# Patient Record
Sex: Male | Born: 1955 | Race: White | Hispanic: No | Marital: Married | Smoking: Never smoker
Health system: Southern US, Community
[De-identification: ages and names within clinical notes are randomized; demographics above are authoritative.]

## PROBLEM LIST (undated history)

## (undated) DIAGNOSIS — M6208 Separation of muscle (nontraumatic), other site: Secondary | ICD-10-CM

## (undated) DIAGNOSIS — H9190 Unspecified hearing loss, unspecified ear: Secondary | ICD-10-CM

## (undated) DIAGNOSIS — M1990 Unspecified osteoarthritis, unspecified site: Secondary | ICD-10-CM

## (undated) DIAGNOSIS — U071 COVID-19: Secondary | ICD-10-CM

## (undated) DIAGNOSIS — K219 Gastro-esophageal reflux disease without esophagitis: Secondary | ICD-10-CM

## (undated) DIAGNOSIS — I10 Essential (primary) hypertension: Secondary | ICD-10-CM

## (undated) DIAGNOSIS — E785 Hyperlipidemia, unspecified: Secondary | ICD-10-CM

## (undated) HISTORY — DX: Separation of muscle (nontraumatic), other site: M62.08

## (undated) HISTORY — DX: Hyperlipidemia, unspecified: E78.5

## (undated) HISTORY — DX: Essential (primary) hypertension: I10

## (undated) HISTORY — PX: VASECTOMY: SHX75

## (undated) HISTORY — PX: SEPTOPLASTY: SUR1290

## (undated) HISTORY — DX: Unspecified hearing loss, unspecified ear: H91.90

## (undated) HISTORY — PX: HERNIA REPAIR: SHX51

## (undated) HISTORY — PX: WISDOM TOOTH EXTRACTION: SHX21

## (undated) HISTORY — DX: Gastro-esophageal reflux disease without esophagitis: K21.9

## (undated) HISTORY — PX: OTHER SURGICAL HISTORY: SHX169

---

## 2019-02-25 HISTORY — PX: COLONOSCOPY: SHX174

## 2020-10-02 NOTE — Progress Notes (Signed)
10/03/20 2:14 PM   Alejandro Hodges 10-20-1955 [id number]  Referring provider:  No referring provider defined for this encounter. Chief Complaint  Patient presents with   Benign Prostatic Hypertrophy     HPI: Alejandro Hodges is a 65 y.o.male who presents today to discuss HOLEP.  Patient has a history of BPH and was seen in the ED on 09/17/2020 for urinary retention of >500 in bladder. A foley catheter was placed and he was discharged with Flomax.   Patient has been followed by [facility] for 10 or more years.  Extensive record review today was performed.  Prostate in 2013 was negative for prostate cancer (PSA 5.33).  TRUS volume at the time was 104g.  He a history of an elevated/risking PSA most recent being 8.52 in 01/2020. He had PHI of 53.8 which correlates to a 33% chace of prostate cancer.  He does mention today that at the time of his prostate biopsy, he had a vasovagal episode.  Patient is accompanied by his wife today.  He is somewhat hard of hearing.  He reports that he is increasingly bothered by his urinary symptoms in light of his recent episode of urinary retention, is now ready to consider surgery for his BPH.  He has been on line, reading about different options and given the size of his prostate, HoLEP was mention.  He is also familiar with one of my patients who had the procedure done had an excellent outcome.  He also mentions today that he and his wife will be traveling to [place] in October.  She has an affinity for penguin's.   IPSS     Row Name 10/03/20 1300         International Prostate Symptom Score   How often have you had the sensation of not emptying your bladder? More than half the time     How often have you had to urinate less than every two hours? Almost always     How often have you found you stopped and started again several times when you urinated? More than half the time     How often have you found it difficult to postpone urination? More than  half the time     How often have you had a weak urinary stream? More than half the time     How often have you had to strain to start urination? Less than half the time     How many times did you typically get up at night to urinate? 2 Times     Total IPSS Score 25           Quality of Life due to urinary symptoms     If you were to spend the rest of your life with your urinary condition just the way it is now how would you feel about that? Terrible             Score:  1-7 Mild 8-19 Moderate 20-35 Severe      PMH: Past Medical History:  Diagnosis Date   Diastasis recti    GERD (gastroesophageal reflux disease)    High frequency hearing loss    Hyperlipoproteinemia    Hypertension     Surgical History: Past Surgical History:  Procedure Laterality Date   gingival graft     HERNIA REPAIR     VASECTOMY     WISDOM TOOTH EXTRACTION      Home Medications:  Allergies as of 10/03/2020  Reactions   Ciprofloxacin Other (See Comments)   Other reaction(s): Myalgias (Muscle Pain), Other (See Comments) tendonitis tendonitis   Meloxicam Other (See Comments)   Other reaction(s): Other Other Reaction: OTHER REACTION Other Reaction: OTHER REACTION   Acacia    Other reaction(s): Other (See Comments)   Nsaids    Other reaction(s): Unknown Son may have had headaches from Naproxen, pt may have had same reaction.        Medication List        Accurate as of October 03, 2020  2:14 PM. If you have any questions, ask your nurse or doctor.          Cholecalciferol 25 MCG (1000 UT) capsule Vitamin D3 25 mcg (1,000 unit) capsule   1500 units every day by oral route.   lisinopril 10 MG tablet Commonly known as: ZESTRIL lisinopril 10 mg tablet   Oil of Oregano 1500 MG Caps oregano oil   Omega-3 1000 MG Caps   omeprazole 40 MG capsule Commonly known as: PRILOSEC Take by mouth.   OVER THE COUNTER MEDICATION Take 500 mg by mouth daily. Take 500 mg by mouth  2 (two) times daily [name] (Prostate Support, 2 tablets two times a day   saw palmetto 80 MG capsule Take by mouth.   tamsulosin 0.4 MG Caps capsule Commonly known as: FLOMAX Take 0.4 mg by mouth daily.        Allergies:  Allergies  Allergen Reactions   Ciprofloxacin Other (See Comments)    Other reaction(s): Myalgias (Muscle Pain), Other (See Comments) tendonitis tendonitis    Meloxicam Other (See Comments)    Other reaction(s): Other Other Reaction: OTHER REACTION Other Reaction: OTHER REACTION    Acacia     Other reaction(s): Other (See Comments)   Nsaids     Other reaction(s): Unknown Son may have had headaches from Naproxen, pt may have had same reaction.    Family History: Family History  Problem Relation Age of Onset   Colon cancer Mother    Colon cancer Paternal Grandmother    Bladder Cancer Neg Hx    Prostate cancer Neg Hx    Kidney cancer Neg Hx    Testicular cancer Neg Hx     Social History:  reports that he has never smoked. He has never used smokeless tobacco. He reports previous alcohol use. He reports that he does not use drugs.   Physical Exam: BP (!) 159/103   Pulse 86   Ht 5\' 9"  (1.753 m)   Wt 188 lb (85.3 kg)   BMI 27.76 kg/m   Constitutional:  Alert and oriented, No acute distress. HEENT: [place] AT, moist mucus membranes.  Trachea midline, no masses. Cardiovascular: No clubbing, cyanosis, or edema. Respiratory: Normal respiratory effort, no increased work of breathing. Skin: No rashes, bruises or suspicious lesions. Neurologic: Grossly intact, no focal deficits, moving all 4 extremities. Psychiatric: Normal mood and affect.  Pertinent Imaging: Results for orders placed or performed in visit on 10/03/20  Microscopic Examination   Urine  Result Value Ref Range   WBC, UA 0-5 0 - 5 /hpf   RBC 0-2 0 - 2 /hpf   Epithelial Cells (non renal) None seen 0 - 10 /hpf   Bacteria, UA None seen None seen/Few  Urinalysis, Complete  Result  Value Ref Range   Specific Gravity, UA 1.015 1.005 - 1.030   pH, UA 7.5 5.0 - 7.5   Color, UA Yellow Yellow   Appearance Ur Clear  Clear   Leukocytes,UA Negative Negative   Protein,UA Negative Negative/Trace   Glucose, UA Negative Negative   Ketones, UA Negative Negative   RBC, UA Trace (A) Negative   Bilirubin, UA Negative Negative   Urobilinogen, Ur 0.2 0.2 - 1.0 mg/dL   Nitrite, UA Negative Negative   Microscopic Examination See below:   Bladder Scan (Post Void Residual) in office  Result Value Ref Range   Scan Result 170       Assessment & Plan:   BPH with outlet obstruction  - Continue Flomax   -Patient is interested in HOLEP; discussed need for anatomic evaluation for the purpose of surgical planning including cystoscopy/more recent TRUS  - We reviewed the surgery in detail today including the preoperative, intraoperative, and postoperative course.  This will most likely be an outpatient procedure pending the degree of post op hematuria.  He will go home with catheter for a few days post op and will either be taught how to remove his own catheter or return to the office for catheter removal.  Risk of bleeding, infection, damage surrounding structures, injury to the bladder/ urethral, bladder neck contracture, ureteral stricture, retrograde ejaculation, stress/ urge incontinence, exacerbation of irritative voiding symptoms were all discussed in detail.     2. Elevated PSA  - elevated and rising with a fairly high PHI  - prostate biopsy to rule out malignancy prior to undergoing outlet procedure  -We discussed prostate biopsy in detail including the procedure itself, the risks of blood in the urine, stool, and ejaculate, serious infection, and discomfort. He is willing to proceed with this as discussed.   3. Incomplete bladder emptying  - PVR today was 170 mL   Follow-up with cystoscopy and prostate biopsy   Tawni Millers as a scribe for [name],  MD.,have documented all relevant documentation on the behalf of [name], MD,as directed by  [name], MD while in the presence of [name], MD.  I have reviewed the above documentation for accuracy and completeness, and I agree with the above.   [name], MD  [facility] [address] [place], [place] [address] [phone]

## 2020-10-03 ENCOUNTER — Encounter: Payer: Self-pay | Admitting: Urology

## 2020-10-03 ENCOUNTER — Other Ambulatory Visit: Payer: Self-pay

## 2020-10-03 ENCOUNTER — Ambulatory Visit (INDEPENDENT_AMBULATORY_CARE_PROVIDER_SITE_OTHER): Payer: Medicare Other | Admitting: Urology

## 2020-10-03 VITALS — BP 159/103 | HR 86 | Ht 69.0 in | Wt 188.0 lb

## 2020-10-03 DIAGNOSIS — N400 Enlarged prostate without lower urinary tract symptoms: Secondary | ICD-10-CM

## 2020-10-03 DIAGNOSIS — R339 Retention of urine, unspecified: Secondary | ICD-10-CM

## 2020-10-03 DIAGNOSIS — R972 Elevated prostate specific antigen [PSA]: Secondary | ICD-10-CM

## 2020-10-03 LAB — URINALYSIS, COMPLETE
Bilirubin, UA: NEGATIVE
Glucose, UA: NEGATIVE
Ketones, UA: NEGATIVE
Leukocytes,UA: NEGATIVE
Nitrite, UA: NEGATIVE
Protein,UA: NEGATIVE
Specific Gravity, UA: 1.015 (ref 1.005–1.030)
Urobilinogen, Ur: 0.2 mg/dL (ref 0.2–1.0)
pH, UA: 7.5 (ref 5.0–7.5)

## 2020-10-03 LAB — MICROSCOPIC EXAMINATION
Bacteria, UA: NONE SEEN
Epithelial Cells (non renal): NONE SEEN /hpf (ref 0–10)

## 2020-10-03 LAB — BLADDER SCAN AMB NON-IMAGING: Scan Result: 170

## 2020-10-03 NOTE — Patient Instructions (Addendum)
Holmium Laser Enucleation of the Prostate (HoLEP)  HoLEP is a treatment for men with benign prostatic hyperplasia (BPH). The laser surgery removed blockages of urine flow, and is done without any incisions on the body.     What is HoLEP?  HoLEP is a type of laser surgery used to treat obstruction (blockage) of urine flow as a result of benign prostatic hyperplasia (BPH). In men with BPH, the prostate gland is not cancerous, but has become enlarged. An enlarged prostate can result in a number of urinary tract symptoms such as weak urinary stream, difficulty in starting urination, inability to urinate, frequent urination, or getting up at night to urinate.  HoLEP was developed in the 1990's as a more effective and less expensive surgical option for BPH, compared to other surgical options such as laser vaporization(PVP/greenlight laser), transurethral resection of the prostate(TURP), and open simple prostatectomy.   What happens during a HoLEP?  HoLEP requires general anesthesia ("asleep" throughout the procedure).   An antibiotic is given to reduce the risk of infection  A surgical instrument called a resectoscope is inserted through the urethra (the tube that carries urine from the bladder). The resectoscope has a camera that allows the surgeon to view the internal structure of the prostate gland, and to see where the incisions are being made during surgery.  The laser is inserted into the resectoscope and is used to enucleate (free up) the enlarged prostate tissue from the capsule (outer shell) and then to seal up any blood vessels. The tissue that has been removed is pushed back into the bladder.  A morcellator is placed through the resectoscope, and is used to suction out the prostate tissue that has been pushed into the bladder.  When the prostate tissue has been removed, the resectoscope is removed, and a foley catheter is placed to allow healing and drain the urine from the  bladder.     What happens after a HoLEP?  More than 90% of patients go home the same day a few hours after surgery. Less than 10% will be admitted to the hospital overnight for observation to monitor the urine, or if they have other medical problems.  Fluid is flushed through the catheter for about 1 hour after surgery to clear any blood from the urine. It is normal to have some blood in the urine after surgery. The need for blood transfusion is extremely rare.  Eating and drinking are permitted after the procedure once the patient has fully awakened from anesthesia.  The catheter is usually removed 2-3 days after surgery- the patient will come to clinic to have the catheter removed and make sure they can urinate on their own.  It is very important to drink lots of fluids after surgery for one week to keep the bladder flushed.  At first, there may be some burning with urination, but this typically improved within a few hours to days. Most patients do not have a significant amount of pain, and narcotic pain medications are rarely needed.  Symptoms of urinary frequency, urgency, and even leakage are NORMAL for the first few weeks after surgery as the bladder adjusts after having to work hard against blockage from the prostate for many years. This will improve, but can sometimes take several months.  The use of pelvic floor exercises (Kegel exercises) can help improve problems with urinary incontinence.   After catheter removal, patients will be seen at 6 weeks and 6 months for symptom check  No heavy lifting for   at least 2-3 weeks after surgery, however patients can walk and do light activities the first day after surgery. Return to work time depends on occupation.    What are the advantages of HoLEP?  HoLEP has been studied in many different parts of the world and has been shown to be a safe and effective procedure. Although there are many types of BPH surgeries available, HoLEP offers a  unique advantage in being able to remove a large amount of tissue without any incisions on the body, even in very large prostates, while decreasing the risk of bleeding and providing tissue for pathology (to look for cancer). This decreases the need for blood transfusions during surgery, minimizes hospital stay, and reduces the risk of needing repeat treatment.  What are the side effects of HoLEP?  Temporary burning and bleeding during urination. Some blood may be seen in the urine for weeks after surgery and is part of the healing process.  Urinary incontinence (inability to control urine flow) is expected in all patients immediately after surgery and they should wear pads for the first few days/weeks. This typically improves over the course of several weeks. Performing Kegel exercises can help decrease leakage from stress maneuvers such as coughing, sneezing, or lifting. The rate of long term leakage is very low. Patients may also have leakage with urgency and this may be treated with medication. The risk of urge incontinence can be dependent on several factors including age, prostate size, symptoms, and other medical problems.  Retrograde ejaculation or "backwards ejaculation." In 75% of cases, the patient will not see any fluid during ejaculation after surgery.  Erectile function is generally not significantly affected.   What are the risks of HoLEP?  Injury to the urethra or development of scar tissue at a later date  Injury to the capsule of the prostate (typically treated with longer catheterization).  Injury to the bladder or ureteral orifices (where the urine from the kidney drains out)  Infection of the bladder, testes, or kidneys  Return of urinary obstruction at a later date requiring another operation (<2%)  Need for blood transfusion or re-operation due to bleeding  Failure to relieve all symptoms and/or need for prolonged catheterization after surgery  5-15% of patients are  found to have previously undiagnosed prostate cancer in their specimen. Prostate cancer can be treated after HoLEP.  Standard risks of anesthesia including blood clots, heart attacks, etc  When should I call my doctor?  Fever over 101.3 degrees  Inability to urinate, or large blood clots in the urine    Prostate Biopsy Instructions  Stop all aspirin or blood thinners (aspirin, plavix, coumadin, warfarin, motrin, ibuprofen, advil, aleve, naproxen, naprosyn) for 7 days prior to the procedure.  If you have any questions about stopping these medications, please contact your primary care physician or cardiologist.  Having a light meal prior to the procedure is recommended.  If you are diabetic or have low blood sugar please bring a small snack or glucose tablet.  A Fleets enema is needed to be purchased over the counter at a local pharmacy and used 2 hours before you scheduled appointment.  This can be purchased over the counter at any pharmacy.  Antibiotics will be administered in the clinic at the time of the procedure unless otherwise specified.    Please bring someone with you to the procedure to drive you home.  A follow up appointment has been scheduled for you to receive the results of the biopsy.  If you have any questions or concerns, please feel free to call the office at [phone] or send a Mychart message.    Thank you, Staff at [facility]    Cystoscopy Cystoscopy is a procedure that is used to help diagnose and sometimes treat conditions that affect the lower urinary tract. The lower urinary tract includes the bladder and the urethra. The urethra is the tube that drains urine from the bladder. Cystoscopy is done using a thin, tube-shaped instrument with a light and camera at the end (cystoscope). The cystoscope may be hard or flexible, depending on the goal of the procedure. The cystoscope is inserted through the urethra, into the bladder. Cystoscopy may be  recommended if you have: Urinary tract infections that keep coming back. Blood in the urine (hematuria). An inability to control when you urinate (urinary incontinence) or an overactive bladder. Unusual cells found in a urine sample. A blockage in the urethra, such as a urinary stone. Painful urination. An abnormality in the bladder found during an intravenous pyelogram (IVP) or CT scan. Cystoscopy may also be done to remove a sample of tissue to be examined under a microscope (biopsy). What are the risks? Generally, this is a safe procedure. However, problems may occur, including: Infection. Bleeding.  What happens during the procedure?  You will be given one or more of the following: A medicine to numb the area (local anesthetic). The area around the opening of your urethra will be cleaned. The cystoscope will be passed through your urethra into your bladder. Germ-free (sterile) fluid will flow through the cystoscope to fill your bladder. The fluid will stretch your bladder so that your health care provider can clearly examine your bladder walls. Your doctor will look at the urethra and bladder. The cystoscope will be removed The procedure may vary among health care providers  What can I expect after the procedure? After the procedure, it is common to have: Some soreness or pain in your abdomen and urethra. Urinary symptoms. These include: Mild pain or burning when you urinate. Pain should stop within a few minutes after you urinate. This may last for up to 1 week. A small amount of blood in your urine for several days. Feeling like you need to urinate but producing only a small amount of urine. Follow these instructions at home: General instructions Return to your normal activities as told by your health care provider.  Do not drive for 24 hours if you were given a sedative during your procedure. Watch for any blood in your urine. If the amount of blood in your urine increases,  call your health care provider. If a tissue sample was removed for testing (biopsy) during your procedure, it is up to you to get your test results. Ask your health care provider, or the department that is doing the test, when your results will be ready. Drink enough fluid to keep your urine pale yellow. Keep all follow-up visits as told by your health care provider. This is important. Contact a health care provider if you: Have pain that gets worse or does not get better with medicine, especially pain when you urinate. Have trouble urinating. Have more blood in your urine. Get help right away if you: Have blood clots in your urine. Have abdominal pain. Have a fever or chills. Are unable to urinate. Summary Cystoscopy is a procedure that is used to help diagnose and sometimes treat conditions that affect the lower urinary tract. Cystoscopy is done using a  thin, tube-shaped instrument with a light and camera at the end. After the procedure, it is common to have some soreness or pain in your abdomen and urethra. Watch for any blood in your urine. If the amount of blood in your urine increases, call your health care provider. If you were prescribed an antibiotic medicine, take it as told by your health care provider. Do not stop taking the antibiotic even if you start to feel better. This information is not intended to replace advice given to you by your health care provider. Make sure you discuss any questions you have with your health care provider. Document Revised: 02/02/2018 Document Reviewed: 02/02/2018 Elsevier Patient Education  2020 [employer].

## 2020-10-10 ENCOUNTER — Ambulatory Visit: Payer: Self-pay | Admitting: Urology

## 2020-10-22 NOTE — Progress Notes (Signed)
   10/24/20     HPI:  Alejandro Hodges is a 65 y.o. male who presents today for prostate biopsy/cystoscopy.   He has a history of elevated/rising PSA his most recent on 10/15/2020 was 9.26. In 2013 he was negative for prostate cancer with a PSA of 5.33. He had PHI of 53.8 which correlates to a 33% chace of prostate cancer.   He also has a history of BPH and was seen in the ED 09/17/2020 for urinary retention of >500 in bladder. He was placed on Flomax.        Vitals:   10/23/20 1131  BP: (!) 167/99  Pulse: 74   NED. A&Ox3.   No respiratory distress   Abd soft, NT, ND Normal external genitalia with patent urethral meatus   Cystoscopy Procedure Note  Patient identification was confirmed, informed consent was obtained, and patient was prepped using Betadine solution.  Lidocaine jelly was administered per urethral meatus.     Pre-Procedure: - Inspection reveals a normal caliber ureteral meatus.  Procedure: The flexible cystoscope was introduced without difficulty - No urethral strictures/lesions are present. -mildly trabeculated bladder - Enlarged prostate trilobar coaptation and bullous edema of the mucosa of prostate - Elevated bladder neck - Bilateral ureteral orifices distorted by elevated bladder neck, unable to visualize - Bladder mucosa  reveals no ulcers, tumors, or lesions - No bladder stones - Mild trabeculation  Retroflexion shows unremarkable    Post-Procedure: - Patient tolerated the procedure well   Prostate Biopsy Procedure   Informed consent was obtained after discussing risks/benefits of the procedure.  A time out was performed to ensure correct patient identity.  Pre-Procedure: - Last PSA Level: 9.26  - Gentamicin given prophylactically - Levaquin 500 mg administered PO -Transrectal Ultrasound performed revealing a 255.3 gm prostate -No significant hypoechoic or median lobe noted  Procedure: - Prostate block performed using 10 cc 1% lidocaine  and biopsies taken from sextant areas, a total of 12 under ultrasound guidance.  Post-Procedure: - Patient tolerated the procedure well - He was counseled to seek immediate medical attention if experiences any severe pain, significant bleeding, or fevers  Assessment and Plan   BPH with outlet obstruction  - continue Flomax  -[name], will discuss this further at his follow-up visit with biopsy results  2. Elevated PSA - PSA is likely appropriate for prostate volume  - Will let us know if he wants virtual visit    - Return in one week to discuss biopsy results/ consideration of HoLEP

## 2020-10-23 ENCOUNTER — Other Ambulatory Visit: Payer: Self-pay

## 2020-10-23 ENCOUNTER — Encounter: Payer: Self-pay | Admitting: Urology

## 2020-10-23 ENCOUNTER — Ambulatory Visit (INDEPENDENT_AMBULATORY_CARE_PROVIDER_SITE_OTHER): Payer: Medicare Other | Admitting: Urology

## 2020-10-23 VITALS — BP 167/99 | HR 74 | Ht 69.0 in | Wt 188.0 lb

## 2020-10-23 DIAGNOSIS — R339 Retention of urine, unspecified: Secondary | ICD-10-CM | POA: Diagnosis not present

## 2020-10-23 DIAGNOSIS — N400 Enlarged prostate without lower urinary tract symptoms: Secondary | ICD-10-CM | POA: Diagnosis not present

## 2020-10-23 LAB — URINALYSIS, COMPLETE
Bilirubin, UA: NEGATIVE
Glucose, UA: NEGATIVE
Ketones, UA: NEGATIVE
Leukocytes,UA: NEGATIVE
Nitrite, UA: NEGATIVE
Protein,UA: NEGATIVE
RBC, UA: NEGATIVE
Specific Gravity, UA: 1.015 (ref 1.005–1.030)
Urobilinogen, Ur: 0.2 mg/dL (ref 0.2–1.0)
pH, UA: 7.5 (ref 5.0–7.5)

## 2020-10-23 LAB — MICROSCOPIC EXAMINATION
Bacteria, UA: NONE SEEN
Epithelial Cells (non renal): NONE SEEN /hpf (ref 0–10)
RBC, Urine: NONE SEEN /hpf (ref 0–2)
WBC, UA: NONE SEEN /hpf (ref 0–5)

## 2020-10-23 MED ORDER — GENTAMICIN SULFATE 40 MG/ML IJ SOLN
80.0000 mg | Freq: Once | INTRAMUSCULAR | Status: AC
Start: 2020-10-23 — End: 2020-10-23
  Administered 2020-10-23: 80 mg via INTRAMUSCULAR

## 2020-10-23 MED ORDER — LEVOFLOXACIN 500 MG PO TABS
500.0000 mg | ORAL_TABLET | Freq: Once | ORAL | Status: AC
Start: 2020-10-23 — End: 2020-10-23
  Administered 2020-10-23: 500 mg via ORAL

## 2020-10-23 NOTE — Patient Instructions (Signed)
Transrectal Ultrasound-Guided Prostate Biopsy, Care After This sheet gives you information about how to care for yourself after your procedure. Your doctor may also give you more specific instructions. If youhave problems or questions, contact your doctor. What can I expect after the procedure? After the procedure, it is common to have: Pain and discomfort in your butt, especially while sitting. Pink-colored pee (urine), due to small amounts of blood in the pee. Burning while peeing (urinating). Blood in your poop (stool). Bleeding from your butt. Blood in your semen. Follow these instructions at home: Medicines Take over-the-counter and prescription medicines only as told by your doctor. If you were prescribed antibiotic medicine, take it as told by your doctor. Do not stop taking the antibiotic even if you start to feel better. Activity  Do not drive for 24 hours if you were given a medicine to help you relax (sedative) during your procedure. Return to your normal activities as told by your doctor. Ask your doctor what activities are safe for you. Ask your doctor when it is okay for you to have sex. Do not lift anything that is heavier than 10 lb (4.5 kg), or the limit that you are told, until your doctor says that it is safe.  General instructions  Drink enough water to keep your pee pale yellow. Watch your pee, poop, and semen for new bleeding or bleeding that gets worse. Keep all follow-up visits as told by your doctor. This is important.  Contact a doctor if you: Have blood clots in your pee or poop. Notice that your pee smells bad or unusual. Have very bad belly pain. Have trouble peeing. Notice that your lower belly feels firm. Have blood in your pee for more than 2 weeks after the procedure. Have blood in your semen for more than 2 months after the procedure. Have problems getting an erection. Feel sick to your stomach (nauseous) or throw up (vomit). Have new or worse  bleeding in your pee, poop, or semen. Get help right away if you: Have a fever or chills. Have bright red pee. Have very bad pain that does not get better with medicine. Cannot pee. Summary After this procedure, it is common to have pain and discomfort around your butt, especially while sitting. You may have blood in your pee and poop. It is common to have blood in your semen for 1-2 months. If you were prescribed antibiotic medicine, take it as told by your doctor. Do not stop taking the antibiotic even if you start to feel better. Get help right away if you have a fever or chills. This information is not intended to replace advice given to you by your health care provider. Make sure you discuss any questions you have with your healthcare provider. Document Revised: 12/26/2019 Document Reviewed: 10/27/2019 Elsevier Patient Education  2022 [employer] Inc[employer]  

## 2020-10-24 ENCOUNTER — Telehealth: Payer: Self-pay | Admitting: Urology

## 2020-10-24 ENCOUNTER — Other Ambulatory Visit: Payer: Self-pay | Admitting: Urology

## 2020-10-24 DIAGNOSIS — N401 Enlarged prostate with lower urinary tract symptoms: Secondary | ICD-10-CM

## 2020-10-24 LAB — SURGICAL PATHOLOGY

## 2020-10-24 NOTE — Telephone Encounter (Signed)
Patient was seen in the office yesterday, 10/23/20 by Dr. Apolinar Junes for a prostate biopsy and cystoscopy.    He has been wearing disposable undergarments since the procedure and has noticed a few drops of blood in the front section of his undergarments.    Patient was advised that what he is experiencing is normal for those procedures.  I reviewed his AVS with him as well.

## 2020-10-25 ENCOUNTER — Telehealth: Payer: Self-pay | Admitting: *Deleted

## 2020-10-25 NOTE — Telephone Encounter (Addendum)
Patient advised-voiced understanding  ----- Message from [name], MD sent at 10/24/2020  3:58 PM EDT ----- Please let this patient know that his prostate biopsy was negative.  I still would like him to follow-up with me next week to review his upcoming procedure and ask/answer any additional questions.  [name], MD

## 2020-10-29 NOTE — Progress Notes (Signed)
10/30/20 4:17 PM   Alejandro Hodges [birth year] [id number]  Referring provider:  No referring provider defined for this encounter. Chief Complaint  Patient presents with   Results     HPI: Alejandro Hodges is a 65 y.o.male who has a personal history of elevate/rising PSA who returns today for prostate biopsy results.    He has a history of elevated/rising PSA his most recent on 10/15/2020 was 9.26. In 2013 he was negative for prostate cancer with a PSA of 5.33. He had PHI of 53.8 which correlates to a 33% chace of prostate cancer. Surgical pathology revealed negative for malignancy.    He also has a history of BPH and was seen in the ED 09/17/2020 for urinary retention of >500 in bladder. He was placed on Flomax.   Currently does not have a foley but worried about recurrent retention.    His prostate was 255.3 gm on 10/23/2020.   He is doing well today and is accompanied by his wife. He has a few question on HoLEP procedure.    PMH: Past Medical History:  Diagnosis Date   Diastasis recti    GERD (gastroesophageal reflux disease)    High frequency hearing loss    Hyperlipoproteinemia    Hypertension     Surgical History: Past Surgical History:  Procedure Laterality Date   gingival graft     HERNIA REPAIR     VASECTOMY     WISDOM TOOTH EXTRACTION      Home Medications:  Allergies as of 10/30/2020       Reactions   Ciprofloxacin Other (See Comments)   Other reaction(s): Myalgias (Muscle Pain), Other (See Comments) tendonitis tendonitis   Meloxicam Other (See Comments)   Other reaction(s): Other Other Reaction: OTHER REACTION Other Reaction: OTHER REACTION   Acacia    Other reaction(s): Other (See Comments)   Nsaids    Other reaction(s): Unknown Son may have had headaches from Naproxen, pt may have had same reaction.        Medication List        Accurate as of October 30, 2020  4:17 PM. If you have any questions, ask your nurse or doctor.          STOP  taking these medications    Omega-3 1000 MG Caps Stopped by: [name], MD       TAKE these medications    Cholecalciferol 25 MCG (1000 UT) capsule Vitamin D3 25 mcg (1,000 unit) capsule   1500 units every day by oral route.   lisinopril 10 MG tablet Commonly known as: ZESTRIL lisinopril 10 mg tablet   Oil of Oregano 1500 MG Caps oregano oil   omeprazole 40 MG capsule Commonly known as: PRILOSEC Take by mouth.   OVER THE COUNTER MEDICATION Take 500 mg by mouth daily. Take 500 mg by mouth 2 (two) times daily [name] (Prostate Support, 2 tablets two times a day   saw palmetto 80 MG capsule Take by mouth.   tamsulosin 0.4 MG Caps capsule Commonly known as: FLOMAX Take 0.4 mg by mouth daily.        Allergies:  Allergies  Allergen Reactions   Ciprofloxacin Other (See Comments)    Other reaction(s): Myalgias (Muscle Pain), Other (See Comments) tendonitis tendonitis    Meloxicam Other (See Comments)    Other reaction(s): Other Other Reaction: OTHER REACTION Other Reaction: OTHER REACTION    Acacia     Other reaction(s): Other (See Comments)   Nsaids  Other reaction(s): Unknown Son may have had headaches from Naproxen, pt may have had same reaction.    Family History: Family History  Problem Relation Age of Onset   Colon cancer Mother    Colon cancer Paternal Grandmother    Bladder Cancer Neg Hx    Prostate cancer Neg Hx    Kidney cancer Neg Hx    Testicular cancer Neg Hx     Social History:  reports that he has never smoked. He has never used smokeless tobacco. He reports that he does not currently use alcohol. He reports that he does not use drugs.   Physical Exam: BP (!) 151/91   Pulse 79   Ht 5' 9" (1.753 m)   Wt 188 lb (85.3 kg)   BMI 27.76 kg/m   Constitutional:  Alert and oriented, No acute distress.  Accompanied by his wife today. HEENT: [name] AT, moist mucus membranes.  Trachea midline, no masses. Cardiovascular: No  clubbing, cyanosis, or edema. Respiratory: Normal respiratory effort, no increased work of breathing. Skin: No rashes, bruises or suspicious lesions. Neurologic: Grossly intact, no focal deficits, moving all 4 extremities. Psychiatric: Normal mood and affect.   Assessment & Plan:   BPH with outlet obstruction  - Continue Flomax See below   Enlarged prostate - We discussed alternatives including TURP vs. holmium laser enucleation of the prostate vs. greenlight laser ablation. Differences between the surgical procedures were discussed as well as the risks and benefits of each.  [name].  We discussed the common postoperative course following holep including need for overnight Foley catheter, temporary worsening of irritative voiding symptoms, and stress incontinence which typically lasts up to 6 months but can persist.  We discussed retrograde ejaculation and damage to surrounding structures including the urinary sphincter. Other uncommon complications including hematuria and urinary tract infection.  He understands all of the above and is willing to proceed as planned.  - All question   Elevated PSA  - Prostate biopsy was negative for malignancy    I,[name],acting as a scribe for [name], MD.,have documented all relevant documentation on the behalf of [name], MD,as directed by  [name], MD while in the presence of [name], MD.  I have reviewed the above documentation for accuracy and completeness, and I agree with the above.   [name], MD  I spent 30 total minutes on the day of the encounter including pre-visit review of the medical record, face-to-face time with the patient, and post visit ordering of labs/imaging/tests.  The majority of the time was spent reviewing the patient's many questions which both he and his wife brought in list form today.   [facility] [address], [address] [place], [place] [address] [phone]  

## 2020-10-29 NOTE — H&P (View-Only) (Signed)
10/30/20 4:17 PM   Alejandro Hodges [birth year] [id number]  Referring provider:  No referring provider defined for this encounter. Chief Complaint  Patient presents with   Results     HPI: Alejandro Hodges is a 65 y.o.male who has a personal history of elevate/rising PSA who returns today for prostate biopsy results.    He has a history of elevated/rising PSA his most recent on 10/15/2020 was 9.26. In 2013 he was negative for prostate cancer with a PSA of 5.33. He had PHI of 53.8 which correlates to a 33% chace of prostate cancer. Surgical pathology revealed negative for malignancy.    He also has a history of BPH and was seen in the ED 09/17/2020 for urinary retention of >500 in bladder. He was placed on Flomax.   Currently does not have a foley but worried about recurrent retention.    His prostate was 255.3 gm on 10/23/2020.   He is doing well today and is accompanied by his wife. He has a few question on HoLEP procedure.    PMH: Past Medical History:  Diagnosis Date   Diastasis recti    GERD (gastroesophageal reflux disease)    High frequency hearing loss    Hyperlipoproteinemia    Hypertension     Surgical History: Past Surgical History:  Procedure Laterality Date   gingival graft     HERNIA REPAIR     VASECTOMY     WISDOM TOOTH EXTRACTION      Home Medications:  Allergies as of 10/30/2020       Reactions   Ciprofloxacin Other (See Comments)   Other reaction(s): Myalgias (Muscle Pain), Other (See Comments) tendonitis tendonitis   Meloxicam Other (See Comments)   Other reaction(s): Other Other Reaction: OTHER REACTION Other Reaction: OTHER REACTION   Acacia    Other reaction(s): Other (See Comments)   Nsaids    Other reaction(s): Unknown Son may have had headaches from Naproxen, pt may have had same reaction.        Medication List        Accurate as of October 30, 2020  4:17 PM. If you have any questions, ask your nurse or doctor.          STOP  taking these medications    Omega-3 1000 MG Caps Stopped by: [name], MD       TAKE these medications    Cholecalciferol 25 MCG (1000 UT) capsule Vitamin D3 25 mcg (1,000 unit) capsule   1500 units every day by oral route.   lisinopril 10 MG tablet Commonly known as: ZESTRIL lisinopril 10 mg tablet   Oil of Oregano 1500 MG Caps oregano oil   omeprazole 40 MG capsule Commonly known as: PRILOSEC Take by mouth.   OVER THE COUNTER MEDICATION Take 500 mg by mouth daily. Take 500 mg by mouth 2 (two) times daily [name] (Prostate Support, 2 tablets two times a day   saw palmetto 80 MG capsule Take by mouth.   tamsulosin 0.4 MG Caps capsule Commonly known as: FLOMAX Take 0.4 mg by mouth daily.        Allergies:  Allergies  Allergen Reactions   Ciprofloxacin Other (See Comments)    Other reaction(s): Myalgias (Muscle Pain), Other (See Comments) tendonitis tendonitis    Meloxicam Other (See Comments)    Other reaction(s): Other Other Reaction: OTHER REACTION Other Reaction: OTHER REACTION    Acacia     Other reaction(s): Other (See Comments)   Nsaids  Other reaction(s): Unknown Son may have had headaches from Naproxen, pt may have had same reaction.    Family History: Family History  Problem Relation Age of Onset   Colon cancer Mother    Colon cancer Paternal Grandmother    Bladder Cancer Neg Hx    Prostate cancer Neg Hx    Kidney cancer Neg Hx    Testicular cancer Neg Hx     Social History:  reports that he has never smoked. He has never used smokeless tobacco. He reports that he does not currently use alcohol. He reports that he does not use drugs.   Physical Exam: BP (!) 151/91   Pulse 79   Ht 5\' 9"  (1.753 m)   Wt 188 lb (85.3 kg)   BMI 27.76 kg/m   Constitutional:  Alert and oriented, No acute distress.  Accompanied by his wife today. HEENT: [place] AT, moist mucus membranes.  Trachea midline, no masses. Cardiovascular: No  clubbing, cyanosis, or edema. Respiratory: Normal respiratory effort, no increased work of breathing. Skin: No rashes, bruises or suspicious lesions. Neurologic: Grossly intact, no focal deficits, moving all 4 extremities. Psychiatric: Normal mood and affect.   Assessment & Plan:   BPH with outlet obstruction  - Continue Flomax See below   Enlarged prostate - We discussed alternatives including TURP vs. holmium laser enucleation of the prostate vs. greenlight laser ablation. Differences between the surgical procedures were discussed as well as the risks and benefits of each.  [name].  We discussed the common postoperative course following holep including need for overnight Foley catheter, temporary worsening of irritative voiding symptoms, and stress incontinence which typically lasts up to 6 months but can persist.  We discussed retrograde ejaculation and damage to surrounding structures including the urinary sphincter. Other uncommon complications including hematuria and urinary tract infection.  He understands all of the above and is willing to proceed as planned.  - All question   Elevated PSA  - Prostate biopsy was negative for malignancy    I,[name],acting as a scribe for , MD.,have documented all relevant documentation on the behalf of [name], MD,as directed by  [name], MD while in the presence of [name], MD.  I have reviewed the above documentation for accuracy and completeness, and I agree with the above.   [name], MD  I spent 30 total minutes on the day of the encounter including pre-visit review of the medical record, face-to-face time with the patient, and post visit ordering of labs/imaging/tests.  The majority of the time was spent reviewing the patient's many questions which both he and his wife brought in list form today.   [facility] [address] [place], [place] [phone]

## 2020-10-30 ENCOUNTER — Other Ambulatory Visit: Payer: Self-pay

## 2020-10-30 ENCOUNTER — Encounter: Payer: Self-pay | Admitting: Urology

## 2020-10-30 ENCOUNTER — Ambulatory Visit (INDEPENDENT_AMBULATORY_CARE_PROVIDER_SITE_OTHER): Payer: Medicare Other | Admitting: Urology

## 2020-10-30 VITALS — BP 151/91 | HR 79 | Ht 69.0 in | Wt 188.0 lb

## 2020-10-30 DIAGNOSIS — N401 Enlarged prostate with lower urinary tract symptoms: Secondary | ICD-10-CM

## 2020-10-30 DIAGNOSIS — R338 Other retention of urine: Secondary | ICD-10-CM | POA: Diagnosis not present

## 2020-10-31 LAB — URINALYSIS, COMPLETE
Bilirubin, UA: NEGATIVE
Glucose, UA: NEGATIVE
Ketones, UA: NEGATIVE
Leukocytes,UA: NEGATIVE
Nitrite, UA: NEGATIVE
Protein,UA: NEGATIVE
Specific Gravity, UA: 1.01 (ref 1.005–1.030)
Urobilinogen, Ur: 0.2 mg/dL (ref 0.2–1.0)
pH, UA: 7 (ref 5.0–7.5)

## 2020-10-31 LAB — MICROSCOPIC EXAMINATION
Bacteria, UA: NONE SEEN
Epithelial Cells (non renal): NONE SEEN /hpf (ref 0–10)

## 2020-11-02 LAB — CULTURE, URINE COMPREHENSIVE

## 2020-11-12 ENCOUNTER — Inpatient Hospital Stay: Admission: RE | Admit: 2020-11-12 | Payer: Self-pay | Source: Ambulatory Visit

## 2020-11-15 ENCOUNTER — Other Ambulatory Visit: Payer: Self-pay

## 2020-11-15 ENCOUNTER — Other Ambulatory Visit
Admission: RE | Admit: 2020-11-15 | Discharge: 2020-11-15 | Disposition: A | Discharge status: Home / Self Care | Payer: Federal, State, Local not specified - PPO | Source: Ambulatory Visit | Attending: Urology | Admitting: Urology

## 2020-11-15 ENCOUNTER — Other Ambulatory Visit
Admission: RE | Admit: 2020-11-15 | Discharge: 2020-11-15 | Disposition: A | Discharge status: Home / Self Care | Payer: Medicare Other | Source: Ambulatory Visit | Attending: Urology | Admitting: Urology

## 2020-11-15 ENCOUNTER — Other Ambulatory Visit: Payer: Medicare Other

## 2020-11-15 DIAGNOSIS — Z01810 Encounter for preprocedural cardiovascular examination: Secondary | ICD-10-CM | POA: Diagnosis present

## 2020-11-15 DIAGNOSIS — N401 Enlarged prostate with lower urinary tract symptoms: Secondary | ICD-10-CM

## 2020-11-15 DIAGNOSIS — N400 Enlarged prostate without lower urinary tract symptoms: Secondary | ICD-10-CM

## 2020-11-15 HISTORY — DX: Unspecified osteoarthritis, unspecified site: M19.90

## 2020-11-15 HISTORY — DX: COVID-19: U07.1

## 2020-11-15 NOTE — Patient Instructions (Addendum)
Your procedure is scheduled on: 11/26/20 - Monday Report to the Registration Desk on the 1st floor of the [facility]. To find out your arrival time, please call [phone] between 1PM - 3PM on: 11/23/20 - Friday  REMEMBER: Instructions that are not followed completely may result in serious medical risk, up to and including death; or upon the discretion of your surgeon and anesthesiologist your surgery may need to be rescheduled.  Do not eat food or drink any fluids after midnight the night before surgery.  No gum chewing, lozengers or hard candies.   TAKE THESE MEDICATIONS THE MORNING OF SURGERY WITH A SIP OF WATER: NONE  One week prior to surgery: Stop Anti-inflammatories (NSAIDS) such as Advil, Aleve, Ibuprofen, Motrin, Naproxen, Naprosyn and Aspirin based products such as Excedrin, Goodys Powder, BC Powder.  Stop ANY OVER THE COUNTER supplements until after surgery. Beginning 11/18/20.  You may take Tylenol as directed if needed for pain up until the day of surgery.  No Alcohol for 24 hours before or after surgery.  No Smoking including e-cigarettes for 24 hours prior to surgery.  No chewable tobacco products for at least 6 hours prior to surgery.  No nicotine patches on the day of surgery.  Do not use any "recreational" drugs for at least a week prior to your surgery.  Please be advised that the combination of cocaine and anesthesia may have negative outcomes, up to and including death. If you test positive for cocaine, your surgery will be cancelled.  On the morning of surgery brush your teeth with toothpaste and water, you may rinse your mouth with mouthwash if you wish. Do not swallow any toothpaste or mouthwash.  Do not wear jewelry, make-up, hairpins, clips or nail polish.  Do not wear lotions, powders, or perfumes.   Do not shave body from the neck down 48 hours prior to surgery just in case you cut yourself which could leave a site for infection.  Also, freshly  shaved skin may become irritated if using the CHG soap.  Contact lenses, hearing aids and dentures may not be worn into surgery.  Do not bring valuables to the hospital. [facility] is not responsible for any missing/lost belongings or valuables.   TNotify your doctor if there is any change in your medical condition (cold, fever, infection).  Wear comfortable clothing (specific to your surgery type) to the hospital.  After surgery, you can help prevent lung complications by doing breathing exercises.  Take deep breaths and cough every 1-2 hours. Your doctor may order a device called an Incentive Spirometer to help you take deep breaths. When coughing or sneezing, hold a pillow firmly against your incision with both hands. This is called "splinting." Doing this helps protect your incision. It also decreases belly discomfort.  If you are being admitted to the hospital overnight, leave your suitcase in the car. After surgery it may be brought to your room.  If you are being discharged the day of surgery, you will not be allowed to drive home. You will need a responsible adult (18 years or older) to drive you home and stay with you that night.   If you are taking public transportation, you will need to have a responsible adult (18 years or older) with you. Please confirm with your physician that it is acceptable to use public transportation.   Please call the Pre-admissions Testing Dept. at [phone] if you have any questions about these instructions.  Surgery Visitation Policy:  Patients undergoing a surgery or procedure may have one family member or support person with them as long as that person is not COVID-19 positive or experiencing its symptoms.  That person may remain in the waiting area during the procedure and may rotate out with other people.  Inpatient Visitation:    Visiting hours are 7 a.m. to 8 p.m. Up to two visitors ages 16+ are allowed at one time in a patient  room. The visitors may rotate out with other people during the day. Visitors must check out when they leave, or other visitors will not be allowed. One designated support person may remain overnight. The visitor must pass COVID-19 screenings, use hand sanitizer when entering and exiting the patient's room and wear a mask at all times, including in the patient's room. Patients must also wear a mask when staff or their visitor are in the room. Masking is required regardless of vaccination status.

## 2020-11-16 ENCOUNTER — Inpatient Hospital Stay: Admission: RE | Admit: 2020-11-16 | Payer: Federal, State, Local not specified - PPO | Source: Ambulatory Visit

## 2020-11-16 LAB — MICROSCOPIC EXAMINATION: Bacteria, UA: NONE SEEN

## 2020-11-16 LAB — URINALYSIS, COMPLETE
Bilirubin, UA: NEGATIVE
Glucose, UA: NEGATIVE
Ketones, UA: NEGATIVE
Leukocytes,UA: NEGATIVE
Nitrite, UA: NEGATIVE
Specific Gravity, UA: 1.01 (ref 1.005–1.030)
Urobilinogen, Ur: 0.2 mg/dL (ref 0.2–1.0)
pH, UA: 7 (ref 5.0–7.5)

## 2020-11-20 LAB — CULTURE, URINE COMPREHENSIVE

## 2020-11-26 ENCOUNTER — Observation Stay
Admission: RE | Admit: 2020-11-26 | Discharge: 2020-11-27 | Disposition: A | Discharge status: Home / Self Care | Payer: Medicare Other | Attending: Urology | Admitting: Urology

## 2020-11-26 ENCOUNTER — Encounter: Payer: Self-pay | Admitting: Urology

## 2020-11-26 ENCOUNTER — Encounter
Admission: RE | Disposition: A | Discharge status: Home / Self Care | Payer: Self-pay | Source: Home / Self Care | Attending: Urology

## 2020-11-26 ENCOUNTER — Ambulatory Visit: Payer: Medicare Other

## 2020-11-26 ENCOUNTER — Other Ambulatory Visit: Payer: Self-pay

## 2020-11-26 DIAGNOSIS — R972 Elevated prostate specific antigen [PSA]: Secondary | ICD-10-CM | POA: Diagnosis present

## 2020-11-26 DIAGNOSIS — N401 Enlarged prostate with lower urinary tract symptoms: Principal | ICD-10-CM | POA: Insufficient documentation

## 2020-11-26 DIAGNOSIS — N138 Other obstructive and reflux uropathy: Secondary | ICD-10-CM

## 2020-11-26 HISTORY — PX: HOLEP-LASER ENUCLEATION OF THE PROSTATE WITH MORCELLATION: SHX6641

## 2020-11-26 SURGERY — ENUCLEATION, PROSTATE, USING LASER, WITH MORCELLATION
Anesthesia: General | Site: Prostate

## 2020-11-26 MED ORDER — ONDANSETRON HCL 4 MG/2ML IJ SOLN
INTRAMUSCULAR | Status: DC | PRN
  Administered 2020-11-26 (×2): 4 mg via INTRAVENOUS

## 2020-11-26 MED ORDER — SUGAMMADEX SODIUM 500 MG/5ML IV SOLN
INTRAVENOUS | Status: DC | PRN
  Administered 2020-11-26: 200 mg via INTRAVENOUS

## 2020-11-26 MED ORDER — SODIUM CHLORIDE 0.9 % IV SOLN: INTRAVENOUS | Status: DC

## 2020-11-26 MED ORDER — PROPOFOL 10 MG/ML IV BOLUS
INTRAVENOUS | Status: DC | PRN
  Administered 2020-11-26: 50 mg via INTRAVENOUS
  Administered 2020-11-26: 150 mg via INTRAVENOUS

## 2020-11-26 MED ORDER — CHLORHEXIDINE GLUCONATE 0.12 % MT SOLN
OROMUCOSAL | Status: AC
  Administered 2020-11-26: 15 mL via OROMUCOSAL
  Filled 2020-11-26: qty 15

## 2020-11-26 MED ORDER — VASOPRESSIN 20 UNIT/ML IV SOLN
INTRAVENOUS | Status: DC | PRN
  Administered 2020-11-26: 1 [IU] via INTRAVENOUS

## 2020-11-26 MED ORDER — OXYCODONE-ACETAMINOPHEN 5-325 MG PO TABS: 1.0000 | ORAL_TABLET | ORAL | Status: DC | PRN

## 2020-11-26 MED ORDER — CEFAZOLIN SODIUM-DEXTROSE 2-4 GM/100ML-% IV SOLN
2.0000 g | INTRAVENOUS | Status: AC
  Administered 2020-11-26: 2 g via INTRAVENOUS

## 2020-11-26 MED ORDER — SODIUM CHLORIDE 0.9 % IR SOLN
Status: DC | PRN
  Administered 2020-11-26: 3000 mL via INTRAVESICAL
  Administered 2020-11-26 (×4): 12000 mL via INTRAVESICAL
  Administered 2020-11-26 (×2): 3000 mL via INTRAVESICAL
  Administered 2020-11-26: 6000 mL via INTRAVESICAL
  Administered 2020-11-26: 12000 mL via INTRAVESICAL

## 2020-11-26 MED ORDER — BELLADONNA ALKALOIDS-OPIUM 16.2-60 MG RE SUPP: 1.0000 | Freq: Four times a day (QID) | RECTAL | Status: DC | PRN

## 2020-11-26 MED ORDER — ACETAMINOPHEN 325 MG PO TABS: 650.0000 mg | ORAL_TABLET | ORAL | Status: DC | PRN

## 2020-11-26 MED ORDER — DOCUSATE SODIUM 100 MG PO CAPS
100.0000 mg | ORAL_CAPSULE | Freq: Two times a day (BID) | ORAL | Status: DC
  Administered 2020-11-26: 100 mg via ORAL
  Filled 2020-11-26 (×2): qty 1

## 2020-11-26 MED ORDER — HYDROCODONE-ACETAMINOPHEN 5-325 MG PO TABS: 1.0000 | ORAL_TABLET | Freq: Four times a day (QID) | ORAL | 0 refills | Status: DC | PRN

## 2020-11-26 MED ORDER — GLYCOPYRROLATE 0.2 MG/ML IJ SOLN
INTRAMUSCULAR | Status: DC | PRN
  Administered 2020-11-26: .2 mg via INTRAVENOUS

## 2020-11-26 MED ORDER — MIDAZOLAM HCL 2 MG/2ML IJ SOLN
INTRAMUSCULAR | Status: DC | PRN
  Administered 2020-11-26: 2 mg via INTRAVENOUS

## 2020-11-26 MED ORDER — ROCURONIUM BROMIDE 100 MG/10ML IV SOLN
INTRAVENOUS | Status: DC | PRN
  Administered 2020-11-26: 50 mg via INTRAVENOUS
  Administered 2020-11-26 (×4): 20 mg via INTRAVENOUS

## 2020-11-26 MED ORDER — ACETAMINOPHEN 10 MG/ML IV SOLN
INTRAVENOUS | Status: DC | PRN
  Administered 2020-11-26: 1000 mg via INTRAVENOUS

## 2020-11-26 MED ORDER — MEPERIDINE HCL 25 MG/ML IJ SOLN: 6.2500 mg | INTRAMUSCULAR | Status: DC | PRN

## 2020-11-26 MED ORDER — MORPHINE SULFATE (PF) 2 MG/ML IV SOLN
2.0000 mg | INTRAVENOUS | Status: DC | PRN
Start: 2020-11-26 — End: 2020-11-27

## 2020-11-26 MED ORDER — PROPOFOL 500 MG/50ML IV EMUL
INTRAVENOUS | Status: AC
  Filled 2020-11-26: qty 100

## 2020-11-26 MED ORDER — FAMOTIDINE 20 MG PO TABS: 20.0000 mg | ORAL_TABLET | Freq: Once | ORAL | Status: AC

## 2020-11-26 MED ORDER — SEVOFLURANE IN SOLN
RESPIRATORY_TRACT | Status: AC
  Filled 2020-11-26: qty 250

## 2020-11-26 MED ORDER — HYDROMORPHONE HCL 1 MG/ML IJ SOLN
INTRAMUSCULAR | Status: AC
  Filled 2020-11-26: qty 1

## 2020-11-26 MED ORDER — OXYBUTYNIN CHLORIDE 5 MG PO TABS: 5.0000 mg | ORAL_TABLET | Freq: Three times a day (TID) | ORAL | 0 refills | Status: DC | PRN

## 2020-11-26 MED ORDER — LACTATED RINGERS IV SOLN: INTRAVENOUS | Status: DC

## 2020-11-26 MED ORDER — CHLORHEXIDINE GLUCONATE 0.12 % MT SOLN
15.0000 mL | Freq: Once | OROMUCOSAL | Status: AC
Start: 2020-11-26 — End: 2020-11-26

## 2020-11-26 MED ORDER — FUROSEMIDE 10 MG/ML IJ SOLN
INTRAMUSCULAR | Status: DC | PRN
  Administered 2020-11-26: 10 mg via INTRAMUSCULAR

## 2020-11-26 MED ORDER — PHENYLEPHRINE HCL (PRESSORS) 10 MG/ML IV SOLN
INTRAVENOUS | Status: AC
  Filled 2020-11-26: qty 1

## 2020-11-26 MED ORDER — TAMSULOSIN HCL 0.4 MG PO CAPS
0.4000 mg | ORAL_CAPSULE | Freq: Every day | ORAL | Status: DC
  Administered 2020-11-26: 0.4 mg via ORAL
  Filled 2020-11-26: qty 1

## 2020-11-26 MED ORDER — CHLORHEXIDINE GLUCONATE CLOTH 2 % EX PADS: 6.0000 | MEDICATED_PAD | Freq: Every day | CUTANEOUS | Status: DC

## 2020-11-26 MED ORDER — FAMOTIDINE 20 MG PO TABS
ORAL_TABLET | ORAL | Status: AC
  Administered 2020-11-26: 20 mg via ORAL
  Filled 2020-11-26: qty 1

## 2020-11-26 MED ORDER — SODIUM CHLORIDE 0.9 % IR SOLN
3000.0000 mL | Status: DC
  Administered 2020-11-26 – 2020-11-27 (×4): 3000 mL

## 2020-11-26 MED ORDER — FENTANYL CITRATE (PF) 100 MCG/2ML IJ SOLN
INTRAMUSCULAR | Status: DC | PRN
  Administered 2020-11-26 (×2): 25 ug via INTRAVENOUS
  Administered 2020-11-26: 50 ug via INTRAVENOUS

## 2020-11-26 MED ORDER — DIPHENHYDRAMINE HCL 50 MG/ML IJ SOLN: 12.5000 mg | Freq: Four times a day (QID) | INTRAMUSCULAR | Status: DC | PRN

## 2020-11-26 MED ORDER — FENTANYL CITRATE (PF) 100 MCG/2ML IJ SOLN: 25.0000 ug | INTRAMUSCULAR | Status: DC | PRN

## 2020-11-26 MED ORDER — DIPHENHYDRAMINE HCL 12.5 MG/5ML PO ELIX
12.5000 mg | ORAL_SOLUTION | Freq: Four times a day (QID) | ORAL | Status: DC | PRN
  Filled 2020-11-26: qty 5

## 2020-11-26 MED ORDER — ONDANSETRON HCL 4 MG/2ML IJ SOLN: 4.0000 mg | INTRAMUSCULAR | Status: DC | PRN

## 2020-11-26 MED ORDER — ACETAMINOPHEN 10 MG/ML IV SOLN
INTRAVENOUS | Status: AC
  Filled 2020-11-26: qty 100

## 2020-11-26 MED ORDER — ZOLPIDEM TARTRATE 5 MG PO TABS: 5.0000 mg | ORAL_TABLET | Freq: Every evening | ORAL | Status: DC | PRN

## 2020-11-26 MED ORDER — OXYCODONE HCL 5 MG PO TABS
5.0000 mg | ORAL_TABLET | Freq: Once | ORAL | Status: DC | PRN
Start: 2020-11-26 — End: 2020-11-26

## 2020-11-26 MED ORDER — OXYBUTYNIN CHLORIDE 5 MG PO TABS: 5.0000 mg | ORAL_TABLET | Freq: Three times a day (TID) | ORAL | Status: DC | PRN

## 2020-11-26 MED ORDER — FENTANYL CITRATE (PF) 100 MCG/2ML IJ SOLN
INTRAMUSCULAR | Status: AC
  Filled 2020-11-26: qty 2

## 2020-11-26 MED ORDER — LIDOCAINE HCL (CARDIAC) PF 100 MG/5ML IV SOSY
PREFILLED_SYRINGE | INTRAVENOUS | Status: DC | PRN
  Administered 2020-11-26: 100 mg via INTRAVENOUS

## 2020-11-26 MED ORDER — CEFAZOLIN SODIUM-DEXTROSE 2-4 GM/100ML-% IV SOLN
INTRAVENOUS | Status: AC
  Filled 2020-11-26: qty 100

## 2020-11-26 MED ORDER — ORAL CARE MOUTH RINSE: 15.0000 mL | Freq: Once | OROMUCOSAL | Status: AC

## 2020-11-26 MED ORDER — MIDAZOLAM HCL 2 MG/2ML IJ SOLN
INTRAMUSCULAR | Status: AC
  Filled 2020-11-26: qty 2

## 2020-11-26 MED ORDER — PROMETHAZINE HCL 25 MG/ML IJ SOLN: 6.2500 mg | INTRAMUSCULAR | Status: DC | PRN

## 2020-11-26 MED ORDER — SODIUM CHLORIDE FLUSH 0.9 % IV SOLN
INTRAVENOUS | Status: AC
  Filled 2020-11-26: qty 10

## 2020-11-26 MED ORDER — OXYCODONE HCL 5 MG/5ML PO SOLN
5.0000 mg | Freq: Once | ORAL | Status: DC | PRN
Start: 2020-11-26 — End: 2020-11-26

## 2020-11-26 MED ORDER — STERILE WATER FOR IRRIGATION IR SOLN
Status: DC | PRN
  Administered 2020-11-26: 1000 mL

## 2020-11-26 MED ORDER — SUGAMMADEX SODIUM 500 MG/5ML IV SOLN
INTRAVENOUS | Status: AC
  Filled 2020-11-26: qty 5

## 2020-11-26 MED ORDER — CEFAZOLIN SODIUM-DEXTROSE 1-4 GM/50ML-% IV SOLN
1.0000 g | Freq: Three times a day (TID) | INTRAVENOUS | Status: AC
  Administered 2020-11-26 – 2020-11-27 (×2): 1 g via INTRAVENOUS
  Filled 2020-11-26 (×3): qty 50

## 2020-11-26 MED ORDER — LISINOPRIL 10 MG PO TABS
10.0000 mg | ORAL_TABLET | Freq: Every day | ORAL | Status: DC
  Administered 2020-11-26: 10 mg via ORAL
  Filled 2020-11-26: qty 1

## 2020-11-26 MED ORDER — PHENYLEPHRINE HCL (PRESSORS) 10 MG/ML IV SOLN
INTRAVENOUS | Status: DC | PRN
  Administered 2020-11-26: 200 ug via INTRAVENOUS
  Administered 2020-11-26 (×2): 100 ug via INTRAVENOUS
  Administered 2020-11-26: 200 ug via INTRAVENOUS

## 2020-11-26 MED ORDER — DEXAMETHASONE SODIUM PHOSPHATE 10 MG/ML IJ SOLN
INTRAMUSCULAR | Status: DC | PRN
  Administered 2020-11-26: 10 mg via INTRAVENOUS

## 2020-11-26 SURGICAL SUPPLY — 34 items
ADAPTER IRRIG TUBE 2 SPIKE SOL (ADAPTER) ×4 IMPLANT
BAG URINE DRAIN 2000ML AR STRL (UROLOGICAL SUPPLIES) IMPLANT
BAG URO DRAIN 4000ML (MISCELLANEOUS) ×2 IMPLANT
CATH FOL 2WAY LX 20X30 (CATHETERS) ×2 IMPLANT
CATH FOL 2WAY LX 22X30 (CATHETERS) IMPLANT
CATH FOLEY 3WAY 30CC 22FR (CATHETERS) IMPLANT
CATH URETL OPEN 5X70 (CATHETERS) ×2 IMPLANT
CONTAINER COLLECT MORCELLATR (MISCELLANEOUS) ×2 IMPLANT
DRAPE 3/4 80X56 (DRAPES) ×2 IMPLANT
DRAPE UTILITY 15X26 TOWEL STRL (DRAPES) IMPLANT
FIBER LASER FLEXIVA PULSE 550 (Laser) IMPLANT
FIBER LASER MOSES 550 DFL (Laser) ×2 IMPLANT
FILTER OVERFLOW MORCELLATOR (FILTER) ×1 IMPLANT
GAUZE 4X4 16PLY ~~LOC~~+RFID DBL (SPONGE) ×4 IMPLANT
GLOVE SURG ENC MOIS LTX SZ6.5 (GLOVE) ×10 IMPLANT
GOWN STRL REUS W/ TWL LRG LVL3 (GOWN DISPOSABLE) ×4 IMPLANT
GOWN STRL REUS W/TWL LRG LVL3 (GOWN DISPOSABLE) ×4
HOLDER FOLEY CATH W/STRAP (MISCELLANEOUS) ×2 IMPLANT
IV NS IRRIG 3000ML ARTHROMATIC (IV SOLUTION) ×50 IMPLANT
KIT TURNOVER CYSTO (KITS) ×2 IMPLANT
MBRN O SEALING YLW 17 FOR INST (MISCELLANEOUS) ×2
MEMBRANE SLNG YLW 17 FOR INST (MISCELLANEOUS) ×1 IMPLANT
MORCELLATOR COLLECT CONTAINER (MISCELLANEOUS) ×4
MORCELLATOR OVERFLOW FILTER (FILTER) ×2
MORCELLATOR ROTATION 4.75 335 (MISCELLANEOUS) ×2 IMPLANT
PACK CYSTO AR (MISCELLANEOUS) ×2 IMPLANT
SET CYSTO W/LG BORE CLAMP LF (SET/KITS/TRAYS/PACK) IMPLANT
SET IRRIG Y TYPE TUR BLADDER L (SET/KITS/TRAYS/PACK) ×2 IMPLANT
SLEEVE PROTECTION STRL DISP (MISCELLANEOUS) ×4 IMPLANT
SYR TOOMEY IRRIG 70ML (MISCELLANEOUS) ×2
SYRINGE TOOMEY IRRIG 70ML (MISCELLANEOUS) ×1 IMPLANT
TUBE PUMP MORCELLATOR PIRANHA (TUBING) ×2 IMPLANT
WATER STERILE IRR 1000ML POUR (IV SOLUTION) ×2 IMPLANT
WATER STERILE IRR 500ML POUR (IV SOLUTION) IMPLANT

## 2020-11-26 NOTE — Anesthesia Preprocedure Evaluation (Addendum)
Anesthesia Evaluation  Patient identified by MRN, date of birth, ID band Patient awake    Reviewed: Allergy & Precautions, NPO status , Patient's Chart, lab work & pertinent test results  History of Anesthesia Complications Negative for: history of anesthetic complications  Airway Mallampati: II  TM Distance: >3 FB Neck ROM: Full    Dental no notable dental hx.    Pulmonary neg pulmonary ROS, neg sleep apnea, neg COPD,    breath sounds clear to auscultation- rhonchi (-) wheezing      Cardiovascular Exercise Tolerance: Good hypertension, Pt. on medications (-) CAD, (-) Past MI, (-) Cardiac Stents and (-) CABG  Rhythm:Regular Rate:Normal - Systolic murmurs and - Diastolic murmurs    Neuro/Psych neg Seizures negative neurological ROS  negative psych ROS   GI/Hepatic Neg liver ROS, GERD  ,  Endo/Other  negative endocrine ROSneg diabetes  Renal/GU negative Renal ROS     Musculoskeletal  (+) Arthritis ,   Abdominal (+) - obese,   Peds  Hematology negative hematology ROS (+)   Anesthesia Other Findings Past Medical History: No date: Arthritis No date: COVID-19 No date: Diastasis recti No date: GERD (gastroesophageal reflux disease) No date: High frequency hearing loss No date: Hyperlipoproteinemia No date: Hypertension   Reproductive/Obstetrics                             Anesthesia Physical Anesthesia Plan  ASA: 2  Anesthesia Plan: General   Post-op Pain Management:    Induction: Intravenous  PONV Risk Score and Plan: 1 and Ondansetron, Dexamethasone and Midazolam  Airway Management Planned: Oral ETT  Additional Equipment:   Intra-op Plan:   Post-operative Plan: Extubation in OR  Informed Consent: I have reviewed the patients History and Physical, chart, labs and discussed the procedure including the risks, benefits and alternatives for the proposed anesthesia with the  patient or authorized representative who has indicated his/her understanding and acceptance.     Dental advisory given  Plan Discussed with: CRNA and Anesthesiologist  Anesthesia Plan Comments:         Anesthesia Quick Evaluation

## 2020-11-26 NOTE — Discharge Instructions (Addendum)
Holmium Laser Enucleation of the Prostate (HoLEP)  HoLEP is a treatment for men with benign prostatic hyperplasia (BPH). The laser surgery removed blockages of urine flow, and is done without any incisions on the body.     What is HoLEP?  HoLEP is a type of laser surgery used to treat obstruction (blockage) of urine flow as a result of benign prostatic hyperplasia (BPH). In men with BPH, the prostate gland is not cancerous, but has become enlarged. An enlarged prostate can result in a number of urinary tract symptoms such as weak urinary stream, difficulty in starting urination, inability to urinate, frequent urination, or getting up at night to urinate.  HoLEP was developed in the 1990's as a more effective and less expensive surgical option for BPH, compared to other surgical options such as laser vaporization(PVP/greenlight laser), transurethral resection of the prostate(TURP), and open simple prostatectomy.   What happens during a HoLEP?  HoLEP requires general anesthesia ("asleep" throughout the procedure).   An antibiotic is given to reduce the risk of infection  A surgical instrument called a resectoscope is inserted through the urethra (the tube that carries urine from the bladder). The resectoscope has a camera that allows the surgeon to view the internal structure of the prostate gland, and to see where the incisions are being made during surgery.  The laser is inserted into the resectoscope and is used to enucleate (free up) the enlarged prostate tissue from the capsule (outer shell) and then to seal up any blood vessels. The tissue that has been removed is pushed back into the bladder.  A morcellator is placed through the resectoscope, and is used to suction out the prostate tissue that has been pushed into the bladder.  When the prostate tissue has been removed, the resectoscope is removed, and a foley catheter is placed to allow healing and drain the urine from the  bladder.     What happens after a HoLEP?  More than 90% of patients go home the same day a few hours after surgery. Less than 10% will be admitted to the hospital overnight for observation to monitor the urine, or if they have other medical problems.  Fluid is flushed through the catheter for about 1 hour after surgery to clear any blood from the urine. It is normal to have some blood in the urine after surgery. The need for blood transfusion is extremely rare.  Eating and drinking are permitted after the procedure once the patient has fully awakened from anesthesia.  The catheter is usually removed 2-3 days after surgery- the patient will come to clinic to have the catheter removed and make sure they can urinate on their own.  It is very important to drink lots of fluids after surgery for one week to keep the bladder flushed.  At first, there may be some burning with urination, but this typically improved within a few hours to days. Most patients do not have a significant amount of pain, and narcotic pain medications are rarely needed.  Symptoms of urinary frequency, urgency, and even leakage are NORMAL for the first few weeks after surgery as the bladder adjusts after having to work hard against blockage from the prostate for many years. This will improve, but can sometimes take several months.  The use of pelvic floor exercises (Kegel exercises) can help improve problems with urinary incontinence.   After catheter removal, patients will be seen at 6 weeks and 6 months for symptom check  No heavy lifting for   at least 2-3 weeks after surgery, however patients can walk and do light activities the first day after surgery. Return to work time depends on occupation.    What are the advantages of HoLEP?  HoLEP has been studied in many different parts of the world and has been shown to be a safe and effective procedure. Although there are many types of BPH surgeries available, HoLEP offers a  unique advantage in being able to remove a large amount of tissue without any incisions on the body, even in very large prostates, while decreasing the risk of bleeding and providing tissue for pathology (to look for cancer). This decreases the need for blood transfusions during surgery, minimizes hospital stay, and reduces the risk of needing repeat treatment.  What are the side effects of HoLEP?  Temporary burning and bleeding during urination. Some blood may be seen in the urine for weeks after surgery and is part of the healing process.  Urinary incontinence (inability to control urine flow) is expected in all patients immediately after surgery and they should wear pads for the first few days/weeks. This typically improves over the course of several weeks. Performing Kegel exercises can help decrease leakage from stress maneuvers such as coughing, sneezing, or lifting. The rate of long term leakage is very low. Patients may also have leakage with urgency and this may be treated with medication. The risk of urge incontinence can be dependent on several factors including age, prostate size, symptoms, and other medical problems.  Retrograde ejaculation or "backwards ejaculation." In 75% of cases, the patient will not see any fluid during ejaculation after surgery.  Erectile function is generally not significantly affected.   What are the risks of HoLEP?  Injury to the urethra or development of scar tissue at a later date  Injury to the capsule of the prostate (typically treated with longer catheterization).  Injury to the bladder or ureteral orifices (where the urine from the kidney drains out)  Infection of the bladder, testes, or kidneys  Return of urinary obstruction at a later date requiring another operation (<2%)  Need for blood transfusion or re-operation due to bleeding  Failure to relieve all symptoms and/or need for prolonged catheterization after surgery  5-15% of patients are  found to have previously undiagnosed prostate cancer in their specimen. Prostate cancer can be treated after HoLEP.  Standard risks of anesthesia including blood clots, heart attacks, etc  When should I call my doctor?    Avoid pelvic floor exercises for 2 weeks postop.  Fever over 101.3 degrees  Inability to urinate, or large blood clots in the urine  AMBULATORY SURGERY  DISCHARGE INSTRUCTIONS   The drugs that you were given will stay in your system until tomorrow so for the next 24 hours you should not:  Drive an automobile Make any legal decisions Drink any alcoholic beverage   You may resume regular meals tomorrow.  Today it is better to start with liquids and gradually work up to solid foods.  You may eat anything you prefer, but it is better to start with liquids, then soup and crackers, and gradually work up to solid foods.   Please notify your doctor immediately if you have any unusual bleeding, trouble breathing, redness and pain at the surgery site, drainage, fever, or pain not relieved by medication.    Additional Instructions:        Please contact your physician with any problems or Same Day Surgery at [phone], Monday through Friday 6  am to 4 pm, or [place] at [facility] number at [phone].

## 2020-11-26 NOTE — Progress Notes (Signed)
Patient arrived to floor from PACU at 1840.  Patient oriented to room and call bell.  Patient alert and orientedx 4

## 2020-11-26 NOTE — Interval H&P Note (Signed)
H&P up-to-date, no changes  Regular rate and rhythm Clear to auscultation bilaterally

## 2020-11-26 NOTE — Anesthesia Postprocedure Evaluation (Signed)
Anesthesia Post Note  Patient: Alejandro Hodges  Procedure(s) Performed: HOLEP-LASER ENUCLEATION OF THE PROSTATE WITH MORCELLATION (Prostate)  Patient location during evaluation: PACU Anesthesia Type: General Level of consciousness: awake and alert and oriented Pain management: pain level controlled Vital Signs Assessment: post-procedure vital signs reviewed and stable Respiratory status: spontaneous breathing, nonlabored ventilation and respiratory function stable Cardiovascular status: blood pressure returned to baseline and stable Postop Assessment: no signs of nausea or vomiting Anesthetic complications: no   No notable events documented.   Last Vitals:  Vitals:   11/26/20 1209 11/26/20 1313  BP: 123/79 115/77  Pulse: 61 76  Resp: 16   Temp: (!) 36.1 C   SpO2: 97% 97%    Last Pain:  Vitals:   11/26/20 1313  TempSrc:   PainSc: 0-No pain                 Kenda Kloehn

## 2020-11-26 NOTE — Transfer of Care (Signed)
Immediate Anesthesia Transfer of Care Note  Patient: Alejandro Hodges  Procedure(s) Performed: HOLEP-LASER ENUCLEATION OF THE PROSTATE WITH MORCELLATION (Prostate)  Patient Location: PACU  Anesthesia Type:General  Level of Consciousness: awake, alert  and oriented  Airway & Oxygen Therapy: Patient Spontanous Breathing and Patient connected to face mask oxygen  Post-op Assessment: Report given to RN and Post -op Vital signs reviewed and stable  Post vital signs: Reviewed and stable  Last Vitals:  Vitals Value Taken Time  BP 124/75 11/26/20 1134  Temp    Pulse 56 11/26/20 1134  Resp 25 11/26/20 1134  SpO2 98 % 11/26/20 1134    Last Pain:  Vitals:   11/26/20 0641  TempSrc: Temporal  PainSc: 0-No pain         Complications: No notable events documented.

## 2020-11-26 NOTE — Progress Notes (Signed)
Pt is being admitted for observation to room 205. Marland Kitchen Report given to Nurse [facility]. Continue to monitor.

## 2020-11-26 NOTE — Progress Notes (Signed)
Patients hearing aids returned 

## 2020-11-26 NOTE — Anesthesia Procedure Notes (Signed)
Procedure Name: Intubation Date/Time: 11/26/2020 7:52 AM Performed by: Mohammed Kindle, CRNA Pre-anesthesia Checklist: Patient identified, Emergency Drugs available, Suction available and Patient being monitored Patient Re-evaluated:Patient Re-evaluated prior to induction Oxygen Delivery Method: Circle system utilized Preoxygenation: Pre-oxygenation with 100% oxygen Induction Type: IV induction Ventilation: Mask ventilation without difficulty Laryngoscope Size: McGraph and 3 Grade View: Grade I Tube type: Oral Tube size: 7.0 mm Number of attempts: 1 Airway Equipment and Method: Stylet and Oral airway Placement Confirmation: ETT inserted through vocal cords under direct vision, positive ETCO2, breath sounds checked- equal and bilateral and CO2 detector Secured at: 21 cm Tube secured with: Tape Dental Injury: Teeth and Oropharynx as per pre-operative assessment

## 2020-11-26 NOTE — Op Note (Signed)
Date of procedure: 11/26/20  Preoperative diagnosis:  BPH with BOO  Postoperative diagnosis:  same   Procedure: HoLEP with morcellation  Surgeon: [name], MD  Anesthesia: General  Complications: None  Intraoperative findings: Significant trilobar coaptation.  Trabeculated bladder.  EBL: 200 cc  Specimens: Prostate chips  Drains: Consuello [place] two-way Foley catheter, 60 cc in the balloon  Indication: Alejandro Hodges is a 65 y.o. patient with massive BPH with bladder outlet obstruction.  After reviewing the management options for treatment, he elected to proceed with the above surgical procedure(s). We have discussed the potential benefits and risks of the procedure, side effects of the proposed treatment, the likelihood of the patient achieving the goals of the procedure, and any potential problems that might occur during the procedure or recuperation. Informed consent has been obtained.  Description of procedure:  The patient was taken to the operating room and general anesthesia was induced.  The patient was placed in the dorsal lithotomy position, prepped and draped in the usual sterile fashion, and preoperative antibiotics were administered. A preoperative time-out was performed.     A 26 French resectoscope sheath using a blunt angled obturator was introduced without difficulty into the bladder.  The bladder was carefully inspected and noted to be moderately trabeculated.  There is an elevated bladder neck with a a significant intravesical component.  The trigone was able to be visualized with some manipulation and the UOs were a good distance from the bladder neck itself.  The prostatic fossa had significant trilobar coaptation with greater than 5 cm prostatic length.  A 550 m laser fiber was then brought in and using settings of 2 J's and 60 Hz, 2 incisions were created at the 5:00 and 7:00 positions of the bladder neck on either side of the median lobe down to the level of  the bladder neck/capsular fibers.  The incision was carried down caudally meeting in the midline just above the verumontanum.  The median lobe was then enucleated from a caudal to cranial direction cleaving the adenoma off the underlying capsule rolling it towards the bladder neck and ultimately cleaving the mucosa to free the median lobe into the bladder.   Next, a semilunar incision was created at the prostatic apex on the left side again freeing up the adenoma from the underlying capsule.  Care was taken to avoid any resection past the verumontanum.  This incision was carried around laterally and cranially towards the bladder neck.  Ultimately, I was able to complete the anterior commissure mucosa and the adenoma into the bladder creating a widely patent prostatic fossa.     Next, the same similar incision was created at the right prostatic apex.  This adenoma however ended up being enucleated and more of a piece wise fashion freeing up a large BPH nodules from the capsular fibers.  Once this was completed and cleared from the bladder neck, the prostatic fossa was noted to be widely patent.  Hemostasis was achieved using hemostatic fiber settings.  Bilateral UOs were visualized and free of any injury.  Finally, the 7 French resectoscope was exchanged for nephroscope and using the Piranha handpiece morcellator, the bladder was distended in each of the prostate chips were evacuated.  The bladder was irrigated several times and smaller joules were clear for the bladder.  This point time, there were no residual fibers appreciated in the bladder.  Hemostasis was adequate.  10 mg of IV Lasix was administered to help with postoperative diuresis.  A  20 [place] two-way Foley catheter was then inserted over a catheter guide with 60 cc in the balloon.  The catheter irrigated easily and well.  Patient was then clean and dry, repositioned supine position, reversed from anesthesia, taken to PACU in stable condition.    Plan: Patient will return to the office in 2 days for voiding trial.  I will see him in 4 to 6 weeks to reassess his urinary symptoms.    [name], M.D.

## 2020-11-26 NOTE — Progress Notes (Signed)
Urology update:  Patient continued to have fairly significant hematuria in the PACU.  He was irrigated several times and the catheter was placed back in traction.  Due to the degree of hematuria, I discussed with the patient and his wife about the the option to stay overnight.  We elected to exchange his Foley catheter for three-way hematuria catheter, 20 French coud tip in which 55 cc was placed in the balloon.  A few small clots were irrigated out and he was placed on slow drip CBI.  Plan for overnight observation on CBI.  [name], MD

## 2020-11-27 ENCOUNTER — Encounter: Payer: Self-pay | Admitting: Urology

## 2020-11-27 ENCOUNTER — Telehealth: Payer: Self-pay

## 2020-11-27 DIAGNOSIS — N401 Enlarged prostate with lower urinary tract symptoms: Secondary | ICD-10-CM | POA: Diagnosis not present

## 2020-11-27 LAB — CBC
HCT: 35.9 % — ABNORMAL LOW (ref 39.0–52.0)
Hemoglobin: 12.1 g/dL — ABNORMAL LOW (ref 13.0–17.0)
MCH: 31.1 pg (ref 26.0–34.0)
MCHC: 33.7 g/dL (ref 30.0–36.0)
MCV: 92.3 fL (ref 80.0–100.0)
Platelets: 169 10*3/uL (ref 150–400)
RBC: 3.89 MIL/uL — ABNORMAL LOW (ref 4.22–5.81)
RDW: 12.6 % (ref 11.5–15.5)
WBC: 8.6 10*3/uL (ref 4.0–10.5)
nRBC: 0 % (ref 0.0–0.2)

## 2020-11-27 LAB — SURGICAL PATHOLOGY

## 2020-11-27 MED ORDER — DOCUSATE SODIUM 100 MG PO CAPS: 100.0000 mg | ORAL_CAPSULE | Freq: Two times a day (BID) | ORAL | 0 refills | Status: DC

## 2020-11-27 NOTE — Plan of Care (Signed)
  Problem: Education: Goal: Knowledge of General Education information will improve Description: Including pain rating scale, medication(s)/side effects and non-pharmacologic comfort measures 11/27/2020 0957 by Sherilyn Banker, RN Outcome: Adequate for Discharge 11/27/2020 0956 by Sherilyn Banker, RN Outcome: Adequate for Discharge   Problem: Health Behavior/Discharge Planning: Goal: Ability to manage health-related needs will improve 11/27/2020 0957 by Sherilyn Banker, RN Outcome: Adequate for Discharge 11/27/2020 0956 by Sherilyn Banker, RN Outcome: Adequate for Discharge   Problem: Clinical Measurements: Goal: Ability to maintain clinical measurements within normal limits will improve 11/27/2020 0957 by Sherilyn Banker, RN Outcome: Adequate for Discharge 11/27/2020 0956 by Sherilyn Banker, RN Outcome: Adequate for Discharge Goal: Will remain free from infection 11/27/2020 0957 by Sherilyn Banker, RN Outcome: Adequate for Discharge 11/27/2020 0956 by Sherilyn Banker, RN Outcome: Adequate for Discharge Goal: Diagnostic test results will improve 11/27/2020 0957 by Sherilyn Banker, RN Outcome: Adequate for Discharge 11/27/2020 0956 by Sherilyn Banker, RN Outcome: Adequate for Discharge Goal: Respiratory complications will improve 11/27/2020 0957 by Sherilyn Banker, RN Outcome: Adequate for Discharge 11/27/2020 0956 by Sherilyn Banker, RN Outcome: Adequate for Discharge Goal: Cardiovascular complication will be avoided 11/27/2020 0957 by Sherilyn Banker, RN Outcome: Adequate for Discharge 11/27/2020 0956 by Sherilyn Banker, RN Outcome: Adequate for Discharge   Problem: Activity: Goal: Risk for activity intolerance will decrease 11/27/2020 0957 by Sherilyn Banker, RN Outcome: Adequate for Discharge 11/27/2020 0956 by Sherilyn Banker, RN Outcome: Adequate for Discharge   Problem: Nutrition: Goal: Adequate nutrition will be maintained 11/27/2020 0957 by Sherilyn Banker, RN Outcome: Adequate for  Discharge 11/27/2020 0956 by Sherilyn Banker, RN Outcome: Adequate for Discharge   Problem: Coping: Goal: Level of anxiety will decrease 11/27/2020 0957 by Sherilyn Banker, RN Outcome: Adequate for Discharge 11/27/2020 0956 by Sherilyn Banker, RN Outcome: Adequate for Discharge   Problem: Elimination: Goal: Will not experience complications related to bowel motility 11/27/2020 0957 by Sherilyn Banker, RN Outcome: Adequate for Discharge 11/27/2020 0956 by Sherilyn Banker, RN Outcome: Adequate for Discharge Goal: Will not experience complications related to urinary retention 11/27/2020 0957 by Sherilyn Banker, RN Outcome: Adequate for Discharge 11/27/2020 0956 by Sherilyn Banker, RN Outcome: Adequate for Discharge   Problem: Pain Managment: Goal: General experience of comfort will improve 11/27/2020 0957 by Sherilyn Banker, RN Outcome: Adequate for Discharge 11/27/2020 0956 by Sherilyn Banker, RN Outcome: Adequate for Discharge   Problem: Safety: Goal: Ability to remain free from injury will improve 11/27/2020 0957 by Sherilyn Banker, RN Outcome: Adequate for Discharge 11/27/2020 0956 by Sherilyn Banker, RN Outcome: Adequate for Discharge   Problem: Skin Integrity: Goal: Risk for impaired skin integrity will decrease 11/27/2020 0957 by Sherilyn Banker, RN Outcome: Adequate for Discharge 11/27/2020 0956 by Sherilyn Banker, RN Outcome: Adequate for Discharge

## 2020-11-27 NOTE — TOC CM/SW Note (Signed)
Patient has orders to discharge home today. Chart reviewed. PCP is Vance Peper, MD. On room air. Has an incision (no dressing). No TOC needs identified. CSW signing off.  Charlynn Court, CSW [phone]

## 2020-11-27 NOTE — Progress Notes (Signed)
AVS reviewed and pt verbalized understanding of all discharge instructions. NAD noted prior to discharge and no concerns voiced.

## 2020-11-27 NOTE — Discharge Summary (Signed)
Date of admission: 11/26/2020  Date of discharge: 11/27/2020  Admission diagnosis: BPH with BOO  Discharge diagnosis: Same as above, gross hematuria  Secondary diagnoses:  Patient Active Problem List   Diagnosis Date Noted   BPH with obstruction/lower urinary tract symptoms 11/26/2020   History and Physical: For full details, please see admission history and physical. Briefly, Alejandro Hodges is a 65 y.o. year old patient admitted on 11/26/2020 for scheduled HOLEP with Dr. Erlene Quan.   This morning he reports no acute concerns.  CBI was discontinued by Dr. Erlene Quan at the bedside approximately 20 minutes prior to my arrival.  Efflux remains light pink with scant blood product sediment with no evidence of clots.  Physical Exam: Constitutional:  Alert and oriented, no acute distress, nontoxic appearing HEENT: [place], AT Cardiovascular: No clubbing, cyanosis, or edema Respiratory: Normal respiratory effort, no increased work of breathing Skin: No rashes, bruises or suspicious lesions Neurologic: Grossly intact, no focal deficits, moving all 4 extremities Psychiatric: Normal mood and affect   Hospital Course: Patient tolerated the procedure well.  He developed gross hematuria in PACU requiring initiation of CBI and admission overnight.  His hospital course was uncomplicated.  On POD#1 he had met discharge criteria: was eating a regular diet, was up and ambulating independently,  pain was well controlled, CBI was discontinued and gross hematuria has significantly improved, Foley catheter was draining well, and was ready for discharge.  Laboratory values:  Recent Labs    11/27/20 0410  WBC 8.6  HGB 12.1*  HCT 35.9*   Results for orders placed or performed in visit on 11/15/20  CULTURE, URINE COMPREHENSIVE     Status: None   Collection Time: 11/15/20  9:57 AM   Specimen: Urine   UR  Result Value Ref Range Status   Urine Culture, Comprehensive Final report  Final   Organism ID, Bacteria Comment   Final    Comment: No growth in 36 - 48 hours.  Microscopic Examination     Status: None   Collection Time: 11/15/20  9:57 AM   Urine  Result Value Ref Range Status   WBC, UA 0-5 0 - 5 /hpf Final   RBC 0-2 0 - 2 /hpf Final   Epithelial Cells (non renal) 0-10 0 - 10 /hpf Final   Bacteria, UA None seen None seen/Few Final   Disposition: Home  Discharge instruction: The patient was instructed to be ambulatory but told to refrain from heavy lifting, strenuous activity, or driving. Foley catheter care instructions were provided to the patient  Discharge medications:  Allergies as of 11/27/2020       Reactions   Ciprofloxacin Other (See Comments)   Myalgias (Muscle Pain), tendonitis   Meloxicam Other (See Comments)   Increased urine frequency    Naproxen    headaches   Nsaids    Upset stomach        Medication List     TAKE these medications    Cholecalciferol 25 MCG (1000 UT) capsule Take 1,000 Units by mouth daily.   docusate sodium 100 MG capsule Commonly known as: COLACE Take 1 capsule (100 mg total) by mouth 2 (two) times daily.   HYDROcodone-acetaminophen 5-325 MG tablet Commonly known as: NORCO/VICODIN Take 1-2 tablets by mouth every 6 (six) hours as needed for moderate pain.   lisinopril 10 MG tablet Commonly known as: ZESTRIL Take 10 mg by mouth at bedtime.   oxybutynin 5 MG tablet Commonly known as: DITROPAN Take 1 tablet (5 mg total)  by mouth every 8 (eight) hours as needed for bladder spasms.   PROSTATE SUPPORT PO Take 1 tablet by mouth 2 (two) times daily.   PSYLLIUM PO Take 1 Dose by mouth 2 (two) times daily. 1 dose = 2 tablespoons   SAW PALMETTO PO Take 2 capsules by mouth 2 (two) times daily.   tamsulosin 0.4 MG Caps capsule Commonly known as: FLOMAX Take 0.4 mg by mouth daily after supper.       Followup:   Follow-up Information     Hollice Espy, MD Follow up in 4 week(s).   Specialty: Urology Why: office will call for follow  up Contact information: [place] [address] [phone]         Debroah Loop, PA-C. Go in 2 day(s).   Specialty: Urology Why: for postop voiding trial Contact information: [place] [address] [phone]

## 2020-11-27 NOTE — Telephone Encounter (Signed)
-----   Message from [name], MD sent at 11/27/2020  1:52 PM EDT ----- FYI pathology is benign, please let him know  [name], MD

## 2020-11-27 NOTE — Telephone Encounter (Signed)
Pt aware.

## 2020-11-28 ENCOUNTER — Other Ambulatory Visit: Payer: Self-pay

## 2020-11-28 ENCOUNTER — Ambulatory Visit (INDEPENDENT_AMBULATORY_CARE_PROVIDER_SITE_OTHER): Payer: Medicare Other | Admitting: Physician Assistant

## 2020-11-28 ENCOUNTER — Ambulatory Visit: Payer: Medicare Other | Admitting: Physician Assistant

## 2020-11-28 ENCOUNTER — Encounter: Payer: Self-pay | Admitting: Physician Assistant

## 2020-11-28 VITALS — BP 113/70 | HR 101 | Ht 69.0 in | Wt 185.0 lb

## 2020-11-28 DIAGNOSIS — N401 Enlarged prostate with lower urinary tract symptoms: Secondary | ICD-10-CM | POA: Diagnosis not present

## 2020-11-28 DIAGNOSIS — R338 Other retention of urine: Secondary | ICD-10-CM

## 2020-11-28 LAB — BLADDER SCAN AMB NON-IMAGING

## 2020-11-28 NOTE — Patient Instructions (Signed)
Congratulations on your recent HOLEP procedure! As discussed in clinic today, there are three main side effects that commonly occur after surgery: Burning or pain with urination: This typically resolves within 1 week of surgery. If you are still having significant pain with urination 10 days after surgery, please call our clinic. We may need to check you for a urinary tract infection at that point, though this is rare. Blood in the urine: This may come and go, but typically resolves completely within 3 weeks of surgery. If you are on blood thinners, it may take longer for the bleeding to resolve. As long as your urine remains thin and runny and you are not passing large clots (around the size of your palm), this is a normal postoperative finding. If you start to pass dark red urine or thick, ketchup-like urine, please call our office immediately. Urinary leakage or urgency: This tends to improve with time, with most patients becoming dry within around 3 months of surgery. You may wear absorbant underwear or liners for security during this time. To help you get dry faster, please make sure you are completing your Kegel exercises as instructed, with a set of 10 exercises completed up to three times daily.  

## 2020-11-28 NOTE — Progress Notes (Signed)
Catheter Removal  Patient is present today for a catheter removal.  2ml of water was drained from the balloon. A 20FR coude three-way foley cath was removed from the bladder no complications were noted . Patient tolerated well.  Performed by: Carman Ching, PA-C   Follow up/ Additional notes: Push fluids and RTC this afternoon for PVR.

## 2020-11-28 NOTE — Progress Notes (Signed)
Afternoon follow-up  Patient returned to clinic this afternoon for repeat PVR. He has been able to urinate and reports significant urinary leakage. PVR 30mL.  Results for orders placed or performed in visit on 11/28/20  BLADDER SCAN AMB NON-IMAGING  Result Value Ref Range   Scan Result 35ml     Voiding trial passed. Counseled patient on normal postoperative findings including dysuria, gross hematuria, and urinary leakage.  Counseled him to wear absorbent pads or underwear for security.  Shared negative pathology results.  Counseled him to start Kegel exercises to 3x10 sets daily.  Patient expressed understanding, all questions answered.  Will schedule postop visit sooner than originally planned due to patient's plans to travel out of the country starting mid November.  Follow up: Return in about 4 weeks (around 12/26/2020) for Postop follow-up with Dr. Apolinar Junes with symptom recheck and PVR.

## 2020-11-29 ENCOUNTER — Encounter: Payer: Self-pay | Admitting: Urology

## 2020-12-08 ENCOUNTER — Telehealth: Payer: Federal, State, Local not specified - PPO | Admitting: Emergency Medicine

## 2020-12-08 DIAGNOSIS — R339 Retention of urine, unspecified: Secondary | ICD-10-CM

## 2020-12-08 NOTE — Progress Notes (Signed)
Based on what you shared with me, I feel your condition warrants further evaluation as soon as possible at an Emergency department. I am concerned that your current condition may be a complication of your surgery.  You may need to have a catheter placed, which is normally done in an emergency room setting.  Please go to your nearest emergency room for further evaluation and management.  If you do not feel comfortable driving please call [phone].     NOTE: There will be NO CHARGE for this eVisit   If you are having a true medical emergency please call 911.      Emergency Department-[facility]  Get Driving Directions  [phone]  [address]  [place], [place] [address]  Open 24/7/365      [facility] Emergency Department at [facility]  Get Driving Directions  [address]  [place], [place] [address]  Open 24/7/365    Emergency Department- [facility]  Get Driving Directions  [phone]  [address]  [place], [place] [address]  Open 24/7/365      [facility] at [facility]  Get Driving Directions  [phone]  [address]  [place], [place] [address]  Open 24/7/365    [facility]  Emergency Department- [facility]  Get Driving Directions  [phone]  [address]  [place], [place] [address]  Open 24/7/365    [place]  Emergency Department- [facility]  Get Driving Directions  [address]  [place], [place] [address]  Open 24/7/365    [facility]  Emergency Department- [facility]  Get Driving Directions  [phone]  [address]  [place], [place] [address]  Open 24/7/365

## 2020-12-10 ENCOUNTER — Telehealth: Payer: Self-pay

## 2020-12-10 NOTE — Telephone Encounter (Signed)
Pt LM on triage line stating that he had an episode on Saturday that he would like to discuss.   Per chart pt has also had an e-visit over the weekend in regards to this issue, in addition pt also spoke with Access Nurse on after hours line.   Called pt he states that he has sent a mychart message to [place] detailing his ordeal over the weekend and would like a response from her. Pt currently denies fever, chills, n/v, pain, dysuria, or signs of urinary retention. He has been able to void with no issues. Will forward messages to provider.

## 2020-12-10 NOTE — Telephone Encounter (Signed)
Responded via MyChart.

## 2020-12-27 ENCOUNTER — Telehealth: Payer: Self-pay

## 2020-12-27 NOTE — Telephone Encounter (Signed)
Pt called complaining of left testicular pain and swelling x 2-3 days. Pt believes he may feel a "little bump on top of his left testical". As per Carollee Herter OK to add to schedule tomorrow. Pt verbalized understanding and scheduled appointment

## 2020-12-28 ENCOUNTER — Ambulatory Visit
Admission: RE | Admit: 2020-12-28 | Discharge: 2020-12-28 | Disposition: A | Discharge status: Home / Self Care | Payer: Medicare Other | Source: Ambulatory Visit | Attending: Physician Assistant | Admitting: Physician Assistant

## 2020-12-28 ENCOUNTER — Other Ambulatory Visit: Payer: Self-pay

## 2020-12-28 ENCOUNTER — Encounter: Payer: Self-pay | Admitting: Physician Assistant

## 2020-12-28 ENCOUNTER — Ambulatory Visit (INDEPENDENT_AMBULATORY_CARE_PROVIDER_SITE_OTHER): Payer: Medicare Other | Admitting: Physician Assistant

## 2020-12-28 VITALS — BP 162/99 | HR 88 | Ht 68.0 in | Wt 184.0 lb

## 2020-12-28 DIAGNOSIS — N50812 Left testicular pain: Secondary | ICD-10-CM

## 2020-12-28 LAB — URINALYSIS, COMPLETE
Bilirubin, UA: NEGATIVE
Glucose, UA: NEGATIVE
Ketones, UA: NEGATIVE
Nitrite, UA: NEGATIVE
Protein,UA: NEGATIVE
Specific Gravity, UA: 1.01 (ref 1.005–1.030)
Urobilinogen, Ur: 0.2 mg/dL (ref 0.2–1.0)
pH, UA: 6 (ref 5.0–7.5)

## 2020-12-28 LAB — MICROSCOPIC EXAMINATION
Bacteria, UA: NONE SEEN
Epithelial Cells (non renal): NONE SEEN /hpf (ref 0–10)

## 2020-12-28 MED ORDER — SULFAMETHOXAZOLE-TRIMETHOPRIM 800-160 MG PO TABS: 1.0000 | ORAL_TABLET | Freq: Two times a day (BID) | ORAL | 0 refills | Status: AC

## 2020-12-28 NOTE — Progress Notes (Signed)
12/28/2020 4:04 PM   Alejandro Hodges Oct 04, 1955 [id number]  CC: Chief Complaint  Patient presents with   Testicle Pain   HPI: Alejandro Hodges is a 65 y.o. male with PMH BPH with BOO who underwent HOLEP with Dr. Apolinar Junes on 11/26/2020 who presents today for evaluation of left testicular swelling and pain.   Today he reports a 3-day history of swelling and tenderness superior to his left testicle.  He denies dysuria or anal sex.  In-office UA today positive for 2+ blood and 1+ leukocyte esterase; urine microscopy with 11-30 WBCs/HPF, 11-30 RBCs/HPF.  PMH: Past Medical History:  Diagnosis Date   Arthritis    COVID-19    Diastasis recti    GERD (gastroesophageal reflux disease)    High frequency hearing loss    Hyperlipoproteinemia    Hypertension     Surgical History: Past Surgical History:  Procedure Laterality Date   COLONOSCOPY  2021   gingival graft     HERNIA REPAIR     HOLEP-LASER ENUCLEATION OF THE PROSTATE WITH MORCELLATION N/A 11/26/2020   Procedure: HOLEP-LASER ENUCLEATION OF THE PROSTATE WITH MORCELLATION;  Surgeon: [name], MD;  Location: [facility] ORS;  Service: Urology;  Laterality: N/A;   SEPTOPLASTY     VASECTOMY     WISDOM TOOTH EXTRACTION      Home Medications:  Allergies as of 12/28/2020       Reactions   Ciprofloxacin Other (See Comments)   Myalgias (Muscle Pain), tendonitis   Meloxicam Other (See Comments)   Increased urine frequency    Naproxen    headaches   Nsaids    Upset stomach        Medication List        Accurate as of December 28, 2020  4:04 PM. If you have any questions, ask your nurse or doctor.          STOP taking these medications    tamsulosin 0.4 MG Caps capsule Commonly known as: FLOMAX Stopped by: Carman Ching, PA-C       TAKE these medications    Cholecalciferol 25 MCG (1000 UT) capsule Take 1,000 Units by mouth daily.   docusate sodium 100 MG capsule Commonly known as: COLACE Take 1  capsule (100 mg total) by mouth 2 (two) times daily.   HYDROcodone-acetaminophen 5-325 MG tablet Commonly known as: NORCO/VICODIN Take 1-2 tablets by mouth every 6 (six) hours as needed for moderate pain.   lisinopril 10 MG tablet Commonly known as: ZESTRIL Take 10 mg by mouth at bedtime.   oxybutynin 5 MG tablet Commonly known as: DITROPAN Take 1 tablet (5 mg total) by mouth every 8 (eight) hours as needed for bladder spasms.   PROSTATE SUPPORT PO Take 1 tablet by mouth 2 (two) times daily.   PSYLLIUM PO Take 1 Dose by mouth 2 (two) times daily. 1 dose = 2 tablespoons   SAW PALMETTO PO Take 2 capsules by mouth 2 (two) times daily.   sulfamethoxazole-trimethoprim 800-160 MG tablet Commonly known as: BACTRIM DS Take 1 tablet by mouth 2 (two) times daily for 10 days. Started by: Carman Ching, PA-C        Allergies:  Allergies  Allergen Reactions   Ciprofloxacin Other (See Comments)    Myalgias (Muscle Pain), tendonitis    Meloxicam Other (See Comments)    Increased urine frequency    Naproxen     headaches   Nsaids     Upset stomach    Family History: Family History  Problem Relation Age of Onset   Colon cancer Mother    Colon cancer Paternal Grandmother    Bladder Cancer Neg Hx    Prostate cancer Neg Hx    Kidney cancer Neg Hx    Testicular cancer Neg Hx     Social History:   reports that he has never smoked. He has never used smokeless tobacco. He reports that he does not currently use alcohol. He reports that he does not use drugs.  Physical Exam: BP (!) 162/99   Pulse 88   Ht 5\' 8"  (1.727 m)   Wt 184 lb (83.5 kg)   BMI 27.98 kg/m   Constitutional:  Alert and oriented, no acute distress, nontoxic appearing HEENT: [place], AT Cardiovascular: No clubbing, cyanosis, or edema Respiratory: Normal respiratory effort, no increased work of breathing GU: Bilateral descended testicles.  Enlarged, palpable left epididymis that is nontender along the  posteroinferior aspect with some enlargement and tenderness superior to the testicle. Skin: No rashes, bruises or suspicious lesions Neurologic: Grossly intact, no focal deficits, moving all 4 extremities Psychiatric: Normal mood and affect  Laboratory Data: Results for orders placed or performed in visit on 12/28/20  CULTURE, URINE COMPREHENSIVE   Specimen: Urine   UR  Result Value Ref Range   Urine Culture, Comprehensive Preliminary report    Organism ID, Bacteria Comment   Microscopic Examination   Urine  Result Value Ref Range   WBC, UA 11-30 (A) 0 - 5 /hpf   RBC 11-30 (A) 0 - 2 /hpf   Epithelial Cells (non renal) None seen 0 - 10 /hpf   Bacteria, UA None seen None seen/Few  Urinalysis, Complete  Result Value Ref Range   Specific Gravity, UA 1.010 1.005 - 1.030   pH, UA 6.0 5.0 - 7.5   Color, UA Yellow Yellow   Appearance Ur Cloudy (A) Clear   Leukocytes,UA 1+ (A) Negative   Protein,UA Negative Negative/Trace   Glucose, UA Negative Negative   Ketones, UA Negative Negative   RBC, UA 2+ (A) Negative   Bilirubin, UA Negative Negative   Urobilinogen, Ur 0.2 0.2 - 1.0 mg/dL   Nitrite, UA Negative Negative   Microscopic Examination See below:    Pertinent Imaging: Stat scrotal ultrasound, 12/28/2020: CLINICAL DATA:  Left upper scrotal pain 4 days, recent HoLEP procedure   EXAM: SCROTAL ULTRASOUND   DOPPLER ULTRASOUND OF THE TESTICLES   TECHNIQUE: Complete ultrasound examination of the testicles, epididymis, and other scrotal structures was performed. Color and spectral Doppler ultrasound were also utilized to evaluate blood flow to the testicles.   COMPARISON:  None.   FINDINGS: Right testicle   Measurements: 5.2 x 2.9 x 3.4 cm. No mass or microlithiasis visualized.   Left testicle   Measurements: 5.5 x 2.5 x 4.0 cm. No mass or microlithiasis visualized.   Right epididymis:  Normal in size and appearance.   Left epididymis:  Mildly enlarged, hyperemic  left epididymal head.   Hydrocele:  Small bilateral hydroceles, left greater than right.   Varicocele:  None visualized.   Pulsed Doppler interrogation of both testes demonstrates normal low resistance arterial and venous waveforms bilaterally.   IMPRESSION: 1. Mildly enlarged, hyperemic left epididymal head, which may reflect epididymitis. 2. Small bilateral hydroceles, left greater than right, likely reactive. 3. Normal sonographic appearance of the bilateral testicles with normal arterial and venous Doppler flow.     Electronically Signed   By: 13/05/2020 M.D.   On: 12/28/2020 15:35  I personally  reviewed the images referenced above and note an enlarged epididymal head.  Assessment & Plan:   1. Pain in left testicle Exam and imaging consistent with left epididymitis.  No evidence of torsion on scrotal ultrasound.  We will start empiric Bactrim given Cipro allergy and send for culture for further evaluation. - Urinalysis, Complete - CULTURE, URINE COMPREHENSIVE - US SCROTUM W/DOPPLER; Future - sulfamethoxazole-trimethoprim (BACTRIM DS) 800-160 MG tablet; Take 1 tablet by mouth 2 (two) times daily for 10 days.  Dispense: 20 tablet; Refill: 0 - Ct Ng M genitalium NAA, Urine  Return if symptoms worsen or fail to improve.  Carman Ching, PA-C  [facility] [address], [address] [place], [place] [address] [phone]

## 2021-01-01 LAB — CULTURE, URINE COMPREHENSIVE

## 2021-01-01 NOTE — Progress Notes (Signed)
01/02/21 11:36 AM   Alejandro Hodges [birth year] [id number]  Referring provider:  Bernette Redbird, MD [address] [place],  [place] [address] Chief Complaint  Patient presents with   Benign Prostatic Hypertrophy    23mo post HOLEP     HPI: Alejandro Hodges is a 65 y.o.male with a personal history of  BPH with BOO  who presents today for 2 month post-op symotom recheck.   He underwent a HoLEP on 11/26/2020. Intraoperative findings showed significant trilobar coaptation and trabeculated bladder.  Pathology revealed stromal and glandular hyperplasia. 153 gm were resected. Post op course complicated by hematuria requiring overnight CBI.,   He was seen in clinic on 12/31/2020 with Alejandro Ching, PA-C, for swelling and tenderness superior to his left testicle. Urine culture grew Corynebacterium species  he was given a course of Bactrim.  He has seen some improvement in swelling and tenderness, has 3 days left of abx.    He is accompanied by his wife today.    He reports today that he is doing Kegel exercises on his own. He reports that he was waking up 1-2x nightly. He does well during the morning but during the afternoon he has and increase in leakage and minimum urine.    He has concern about recent infection and when it should clear. He had a recent trip planned that he does not believe his urinary symptoms will allow for him to go on.    IPSS     Row Name 01/02/21 1100         International Prostate Symptom Score   How often have you had the sensation of not emptying your bladder? Less than 1 in 5     How often have you had to urinate less than every two hours? More than half the time     How often have you found you stopped and started again several times when you urinated? Not at All     How often have you found it difficult to postpone urination? More than half the time     How often have you had a weak urinary stream? Not at All     How often have you had to  strain to start urination? Not at All     How many times did you typically get up at night to urinate? 2 Times     Total IPSS Score 11       Quality of Life due to urinary symptoms   If you were to spend the rest of your life with your urinary condition just the way it is now how would you feel about that? Terrible              Score:  1-7 Mild 8-19 Moderate 20-35 Severe    PMH: Past Medical History:  Diagnosis Date   Arthritis    COVID-19    Diastasis recti    GERD (gastroesophageal reflux disease)    High frequency hearing loss    Hyperlipoproteinemia    Hypertension     Surgical History: Past Surgical History:  Procedure Laterality Date   COLONOSCOPY  2021   gingival graft     HERNIA REPAIR     HOLEP-LASER ENUCLEATION OF THE PROSTATE WITH MORCELLATION N/A 11/26/2020   Procedure: HOLEP-LASER ENUCLEATION OF THE PROSTATE WITH MORCELLATION;  Surgeon: [name], MD;  Location: [facility] ORS;  Service: Urology;  Laterality: N/A;   SEPTOPLASTY     VASECTOMY     WISDOM TOOTH  EXTRACTION      Home Medications:  Allergies as of 01/02/2021       Reactions   Ciprofloxacin Other (See Comments)   Myalgias (Muscle Pain), tendonitis   Meloxicam Other (See Comments)   Increased urine frequency    Naproxen    headaches   Nsaids    Upset stomach        Medication List        Accurate as of January 02, 2021 11:36 AM. If you have any questions, ask your nurse or doctor.          Cholecalciferol 25 MCG (1000 UT) capsule Take 1,000 Units by mouth daily.   docusate sodium 100 MG capsule Commonly known as: COLACE Take 1 capsule (100 mg total) by mouth 2 (two) times daily.   HYDROcodone-acetaminophen 5-325 MG tablet Commonly known as: NORCO/VICODIN Take 1-2 tablets by mouth every 6 (six) hours as needed for moderate pain.   lisinopril 10 MG tablet Commonly known as: ZESTRIL Take 10 mg by mouth at bedtime.   oxybutynin 5 MG tablet Commonly known as:  DITROPAN Take 1 tablet (5 mg total) by mouth every 8 (eight) hours as needed for bladder spasms.   PROSTATE SUPPORT PO Take 1 tablet by mouth 2 (two) times daily.   PSYLLIUM PO Take 1 Dose by mouth 2 (two) times daily. 1 dose = 2 tablespoons   SAW PALMETTO PO Take 2 capsules by mouth 2 (two) times daily.   sulfamethoxazole-trimethoprim 800-160 MG tablet Commonly known as: BACTRIM DS Take 1 tablet by mouth 2 (two) times daily for 10 days.        Allergies:  Allergies  Allergen Reactions   Ciprofloxacin Other (See Comments)    Myalgias (Muscle Pain), tendonitis    Meloxicam Other (See Comments)    Increased urine frequency    Naproxen     headaches   Nsaids     Upset stomach    Family History: Family History  Problem Relation Age of Onset   Colon cancer Mother    Colon cancer Paternal Grandmother    Bladder Cancer Neg Hx    Prostate cancer Neg Hx    Kidney cancer Neg Hx    Testicular cancer Neg Hx     Social History:  reports that he has never smoked. He has never used smokeless tobacco. He reports that he does not currently use alcohol. He reports that he does not use drugs.   Physical Exam: BP (!) 154/96   Pulse 76   Ht 5\' 10"  (1.778 m)   Wt 183 lb (83 kg)   BMI 26.26 kg/m   Constitutional:  Alert and oriented, No acute distress. HEENT: [place] AT, moist mucus membranes.  Trachea midline, no masses. Cardiovascular: No clubbing, cyanosis, or edema. Respiratory: Normal respiratory effort, no increased work of breathing. Skin: No rashes, bruises or suspicious lesions. Neurologic: Grossly intact, no focal deficits, moving all 4 extremities. Psychiatric: Normal mood and affect.  Pertinent Imaging: Results for orders placed or performed in visit on 01/02/21  BLADDER SCAN AMB NON-IMAGING  Result Value Ref Range   Scan Result 81mL      Assessment & Plan:    Enlarged Prostate / BPH with urinary obstruction/history of urinary retention - S/p HoLEP  -  Discussed common post-operative course following HoLEP inluding new urinary symptoms.   Stress incontinence  - Discussed importance of maintaining pelvic floor muscles through pelvic floor exercises. Recommend pursuing physical therapy.  - Referral to physical  therapist sent .    Return in 6 months IPSS, PVR, and PSA   I,[name],acting as a scribe for Hollice Espy, MD.,have documented all relevant documentation on the behalf of Hollice Espy, MD,as directed by  Hollice Espy, MD while in the presence of Hollice Espy, MD.  I have reviewed the above documentation for accuracy and completeness, and I agree with the above.   Hollice Espy, MD    [facility] [address] [place], [place] [address] [phone]

## 2021-01-02 ENCOUNTER — Encounter: Payer: Self-pay | Admitting: Urology

## 2021-01-02 ENCOUNTER — Ambulatory Visit (INDEPENDENT_AMBULATORY_CARE_PROVIDER_SITE_OTHER): Payer: Medicare Other | Admitting: Urology

## 2021-01-02 ENCOUNTER — Other Ambulatory Visit: Payer: Self-pay

## 2021-01-02 VITALS — BP 154/96 | HR 76 | Ht 70.0 in | Wt 183.0 lb

## 2021-01-02 DIAGNOSIS — N393 Stress incontinence (female) (male): Secondary | ICD-10-CM

## 2021-01-02 DIAGNOSIS — N401 Enlarged prostate with lower urinary tract symptoms: Secondary | ICD-10-CM

## 2021-01-02 DIAGNOSIS — R338 Other retention of urine: Secondary | ICD-10-CM | POA: Diagnosis not present

## 2021-01-02 DIAGNOSIS — R972 Elevated prostate specific antigen [PSA]: Secondary | ICD-10-CM

## 2021-01-02 LAB — BLADDER SCAN AMB NON-IMAGING

## 2021-01-04 LAB — CT NG M GENITALIUM NAA, URINE
Chlamydia trachomatis, NAA: NEGATIVE
Mycoplasma genitalium NAA: NEGATIVE
Neisseria gonorrhoeae, NAA: NEGATIVE

## 2021-01-10 ENCOUNTER — Other Ambulatory Visit: Payer: Self-pay

## 2021-01-10 ENCOUNTER — Ambulatory Visit: Payer: Medicare Other | Attending: Urology | Admitting: Physical Therapy

## 2021-01-10 ENCOUNTER — Encounter: Payer: Self-pay | Admitting: Physical Therapy

## 2021-01-10 DIAGNOSIS — M6281 Muscle weakness (generalized): Secondary | ICD-10-CM | POA: Diagnosis present

## 2021-01-10 DIAGNOSIS — N393 Stress incontinence (female) (male): Secondary | ICD-10-CM | POA: Insufficient documentation

## 2021-01-10 DIAGNOSIS — R278 Other lack of coordination: Secondary | ICD-10-CM | POA: Diagnosis present

## 2021-01-10 DIAGNOSIS — R32 Unspecified urinary incontinence: Secondary | ICD-10-CM | POA: Diagnosis present

## 2021-01-10 NOTE — Therapy (Signed)
[facility] [address] [place], [place], [address] Phone: [phone]   Fax:  [phone]  Physical Therapy Evaluation  Patient Details  Name: Alejandro Hodges MRN: [id number] Date of Birth: 09-Feb-1956 Referring Provider (PT): [name]  Encounter Date: 01/10/2021   PT End of Session - 01/10/21 1028     Visit Number 1    Number of Visits 12    Date for PT Re-Evaluation 04/04/21    Authorization Type IE 01/10/2021    PT Start Time 1030    PT Stop Time 1110    PT Time Calculation (min) 40 min    Activity Tolerance Patient tolerated treatment well    Behavior During Therapy [facility] for tasks assessed/performed             Past Medical History:  Diagnosis Date   Arthritis    COVID-19    Diastasis recti    GERD (gastroesophageal reflux disease)    High frequency hearing loss    Hyperlipoproteinemia    Hypertension     Past Surgical History:  Procedure Laterality Date   COLONOSCOPY  2021   gingival graft     HERNIA REPAIR     HOLEP-LASER ENUCLEATION OF THE PROSTATE WITH MORCELLATION N/A 11/26/2020   Procedure: HOLEP-LASER ENUCLEATION OF THE PROSTATE WITH MORCELLATION;  Surgeon: [name], MD;  Location: [facility] ORS;  Service: Urology;  Laterality: N/A;   SEPTOPLASTY     VASECTOMY     WISDOM TOOTH EXTRACTION      There were no vitals filed for this visit.     PELVIC HEALTH PHYSICAL THERAPY EVALUATION  SCREENING Red Flags: None Have you had any night sweats? Unexplained weight loss? Saddle anesthesia? Unexplained changes in bowel or bladder habits?   SUBJECTIVE  Chief Complaint: Patient notes that he is dealing with leakage more so in the mid to late afternoon. Patient notes that he is now only dealing with nocturia 2x; patient notes he is emptying up to 700-800 mL at first nocturnal void. Patient gets up around 620A and until mid day he manages leakage well, but after 1P he has increased leakage with  standing activities. Patient notes that his sensation is returning and now he can notice when he is leaking. Patient has been trying to use PFM to control leakage. Patient notes he can feel a good contraction early in the AM, but in the afternoon he cannot get as strong of a contraction. Patient notes that he has had bladder spasms prior to surgery. Patient notes that he has finished his course of antibiotics and feels no continued tenderness. Patient reports that he frequently has leakage with position changes and activity. Patient notes that sitting his leakage is much less.   Pertinent History:  Falls Negative. Scoliosis Negative. Pulmonary disease/dysfunction Positive.  Surgical history: Positive for see above.   Urological History: HoLEP: Positive  Catheterization: Yes ; Duration: roughly 36 hours Complications: bladder irrigation performed 2/2 to hematuria PVR: 4-5 mL per patient   Urinary History: Incontinence: Positive. Onset: 11/26/2020 Triggers: movement, later in the day Amount: Min/Mod. Protective undergarments: Yes Number used/day: 4-6x (depends on activity) Fluid Intake: 64+ oz H20, occasional tea herbal Nocturia: 2x/night Frequency of urination: every 1.5-2 hours in AM; increases in the PM Pain with urination: Negative  Difficulty initiating urination: Negative  Intermittent stream: Negative  Post-micturition dribble: Positive for minimal.   Gastrointestinal History: Patient denies any significant concerns.   Location of pain: n/a  OBJECTIVE  Mental Status Patient is oriented to person, place and time. Recent memory is intact. Remote memory is intact. Attention span and concentration are intact. Expressive speech is intact. Patient's fund of knowledge is within normal limits for educational level.  POSTURE/OBSERVATIONS:  Lumbar lordosis: diminished Thoracic kyphosis: mildly increased Iliac crest height: appearing equal bilaterally   GAIT: Grossly  WNL  SENSATION: deferred 2/2 to time constraints   RANGE OF MOTION: deferred 2/2 to time constraints    LEFT RIGHT  Lumbar forward flexion (65):      Lumbar extension (30):     Lumbar lateral flexion (25):     Thoracic and Lumbar rotation (30 degrees):       Hip Flexion (0-125):      Hip IR (0-45):     Hip ER (0-45):     Hip Abduction (0-40):     Hip extension (0-15):       STRENGTH: MMT deferred 2/2 to time constraints   RLE LLE  Hip Flexion    Hip Extension    Hip Abduction     Hip Adduction     Hip ER     Hip IR     Knee Extension    Knee Flexion    Dorsiflexion     Plantarflexion (seated)     ABDOMINAL: deferred 2/2 to time constraints  Palpation: Suprapubic percussion:  Diastasis: Scar mobility:  Rib flare:  SPECIAL TESTS: deferred 2/2 to time constraints   PHYSICAL PERFORMANCE MEASURES:  STS: Deep Squat: RLE STS: LLE STS:  6MWT:  5TSTS:  EXTERNAL PELVIC EXAM: deferred 2/2 to time constraints  Palpation: Breath coordination: Perineal Mobility Testing: Voluntary Contraction: present/absent Relaxation: present/delayed/ non-relaxing Perineal movement with sustained increase in IAP ("bear down"):  Perineal movement with rapid increase in IAP ("cough"):  DIGITAL RECTAL EXAM: deferred 2/2 to time constraints  Strength (PERF): Symmetry: Palpation: Prolapse:  OUTCOME MEASURES: IPSS (9; QoL 6) International Prostate Symptom Score   How often have you had the sensation of not emptying your bladder? Less than 1 in 5 times     How often have you had to urinate less than every two hours? About half the time      How often have you found you stopped and started again several times when you urinated? Not at All      How often have you found it difficult to postpone urination? About half the time      How often have you had a weak urinary stream? Not at All      How often have you had to strain to start urination? Not at All      How many times did you  typically get up at night to urinate? 2      Total IPSS Score 9      ASSESSMENT Patient is a 65 year old presenting to clinic with chief complaints of urinary leakage s/p HoLEP. Today's evaluation is suggestive of deficits in PFM strength, PFM coordination, IAP management, and posture as evidenced by increased thoracic kyphosis with rounded shoulders, presence of DRAM, urinary incontinence with activity/movement, frequency ranging from 0.5-2 hours, average use of protective undergarments 4-6/day. Patient's responses on IPSS outcome measures (9, QoL 6) indicate moderate functional limitations/disability/distress. Patient's progress may be limited due to comorbidities; however, patient's motivation is advantageous. Patient will benefit from continued skilled therapeutic intervention to address deficits in PFM strength, PFM coordination, IAP management, and posture in order to increase function and improve overall QOL.  EDUCATION  Patient educated on prognosis, POC, and provided with HEP including: not initiated. Patient articulated understanding and returned demonstration. Patient will benefit from further education in order to maximize compliance and understanding for long-term therapeutic gains.   Objective measurements completed on examination: See above findings.        PT Long Term Goals - 01/10/21 1623       PT LONG TERM GOAL #1   Title Patient will demonstrate independence with HEP in order to maximize therapeutic gains and improve carryover from physical therapy sessions to ADLs in the home and community.    Baseline IE: not initiated    Time 12    Period Weeks    Status New    Target Date 04/04/21      PT LONG TERM GOAL #2   Title Patient will demonstrate circumferential and sequential contraction of >4/5 MMT, > 6 sec hold x10 and 5 consecutive quick flicks with </= 10 min rest between testing bouts, and relaxation of the PFM coordinated with breath for improved management of  intra-abdominal pressure and normal bowel and bladder function without the presence of pain nor incontinence in order to improve participation at home and in the community.    Baseline IE: not demonstrated    Time 12    Period Weeks    Status New    Target Date 04/04/21      PT LONG TERM GOAL #3   Title Patient will report decreased reliance on protective undergarments as indicated by < 3/24 hour period to demonstrate improved bladder control and allow for increased participation in activities outside of the home.    Baseline IE: 4-6x    Time 12    Period Weeks    Status New    Target Date 04/04/21      PT LONG TERM GOAL #4   Title Patient will demonstrate improved function as evidenced by a score of <5 on IPSS measure with QoL rated <3 for full participation in activities at home and in the community.    Baseline IE: 9, 6 QoL    Time 12    Period Weeks    Status New    Target Date 04/04/21                    Plan - 01/10/21 1029     Clinical Impression Statement Patient is a 65 year old presenting to clinic with chief complaints of urinary leakage s/p HoLEP. Today's evaluation is suggestive of deficits in PFM strength, PFM coordination, IAP management, and posture as evidenced by increased thoracic kyphosis with rounded shoulders, presence of DRAM, urinary incontinence with activity/movement, frequency ranging from 0.5-2 hours, average use of protective undergarments 4-6/day. Patient's responses on IPSS outcome measures (9, QoL 6) indicate moderate functional limitations/disability/distress. Patient's progress may be limited due to comorbidities; however, patient's motivation is advantageous. Patient will benefit from continued skilled therapeutic intervention to address deficits in PFM strength, PFM coordination, IAP management, and posture in order to increase function and improve overall QOL.    Personal Factors and Comorbidities Age;Comorbidity 3+;Past/Current  Experience;Time since onset of injury/illness/exacerbation;Behavior Pattern    Comorbidities GERD, HTN, diastasis recti, arthritis, OSA    Examination-Activity Limitations Continence;Squat;Stand;Sit;Dressing;Transfers;Lift    Examination-Participation Restrictions Community Activity;Laundry;Cleaning    Stability/Clinical Decision Making Evolving/Moderate complexity    Clinical Decision Making Moderate    Rehab Potential Fair    PT Frequency 1x / week    PT Duration 12 weeks  PT Treatment/Interventions ADLs/Self Care Home Management;Cryotherapy;Electrical Stimulation;Moist Heat;Therapeutic activities;Therapeutic exercise;Neuromuscular re-education;Patient/family education;Orthotic Fit/Training;Manual techniques;Scar mobilization;Taping;Spinal Manipulations;Joint Manipulations    PT Next Visit Plan physical assessment    PT Home Exercise Plan not initiated    Consulted and Agree with Plan of Care Patient             Patient will benefit from skilled therapeutic intervention in order to improve the following deficits and impairments:  Decreased strength, Decreased coordination, Decreased activity tolerance, Improper body mechanics, Decreased endurance  Visit Diagnosis: Muscle weakness (generalized)  Other lack of coordination  Urinary incontinence, unspecified type     Problem List Patient Active Problem List   Diagnosis Date Noted   BPH with obstruction/lower urinary tract symptoms 11/26/2020    Myles Gip PT, DPT [phone]  01/10/2021, 4:25 PM  [place] [facility] [address] [place], [place], [address] Phone: [phone]   Fax:  [phone]  Name: Mamady Artzer MRN: [id number] Date of Birth: 08-17-1955

## 2021-01-21 ENCOUNTER — Other Ambulatory Visit: Payer: Self-pay

## 2021-01-21 ENCOUNTER — Encounter: Payer: Self-pay | Admitting: Physical Therapy

## 2021-01-21 ENCOUNTER — Ambulatory Visit: Payer: Medicare Other | Admitting: Physical Therapy

## 2021-01-21 DIAGNOSIS — M6281 Muscle weakness (generalized): Secondary | ICD-10-CM | POA: Diagnosis not present

## 2021-01-21 DIAGNOSIS — R278 Other lack of coordination: Secondary | ICD-10-CM

## 2021-01-21 DIAGNOSIS — R32 Unspecified urinary incontinence: Secondary | ICD-10-CM

## 2021-01-21 NOTE — Therapy (Signed)
[facility] [address] [place], [place], [address] Phone: [phone]   Fax:  [phone]  Physical Therapy Treatment  Patient Details  Name: Alejandro Hodges MRN: [id number] Date of Birth: [birth year] Referring Provider (PT): [name]   Encounter Date: 01/21/2021   PT End of Session - 01/21/21 1032     Visit Number 2    Number of Visits 12    Date for PT Re-Evaluation 04/04/21    Authorization Type IE 01/10/2021    PT Start Time 1030    PT Stop Time 1110    PT Time Calculation (min) 40 min    Activity Tolerance Patient tolerated treatment well    Behavior During Therapy [facility] for tasks assessed/performed             Past Medical History:  Diagnosis Date   Arthritis    COVID-19    Diastasis recti    GERD (gastroesophageal reflux disease)    High frequency hearing loss    Hyperlipoproteinemia    Hypertension     Past Surgical History:  Procedure Laterality Date   COLONOSCOPY  2021   gingival graft     HERNIA REPAIR     HOLEP-LASER ENUCLEATION OF THE PROSTATE WITH MORCELLATION N/A 11/26/2020   Procedure: HOLEP-LASER ENUCLEATION OF THE PROSTATE WITH MORCELLATION;  Surgeon: [name], MD;  Location: [facility] ORS;  Service: Urology;  Laterality: N/A;   SEPTOPLASTY     VASECTOMY     WISDOM TOOTH EXTRACTION      There were no vitals filed for this visit.   Subjective Assessment - 01/21/21 1027     Subjective Patient notes that he has questions regarding evaluation note. Patient notes that he has LLE weakness most noticeable with SLS. Patient adds that he does have UI with flatus as well. Patient was able to go overnight without leakage until 5A, but did get up 2x to void.    Currently in Pain? No/denies             TREATMENT  Pre-treatment assessment: RANGE OF MOTION:    LEFT RIGHT  Lumbar forward flexion (65):  WFL    Lumbar extension (30): WFL    Lumbar lateral flexion (25):  [facility]  Thoracic  and Lumbar rotation (30 degrees):    WFL WFL   STRENGTH: MMT   RLE LLE  Hip Flexion 5 5* significant trunk compensations  Hip Extension    Hip Abduction  5 5  Hip Adduction  5 5  Hip ER  5 5  Hip IR  5 5  Knee Extension 5 5  Knee Flexion 5 5  Dorsiflexion  5 4  Plantarflexion (seated) 5 4   ABDOMINAL:  Palpation: no TTP Diastasis: present at umbilicus 2.5 finger width with increased laxity noted throughout linea alba albeit width WNL superior and inferior  EXTERNAL PELVIC EXAM: Patient educated on the purpose of the pelvic exam and articulated understanding; patient consented to the exam verbally. Breath coordination: present Perineal Mobility Testing: Voluntary Contraction: present; roughly 2/5 MMT; able to sequence 3 repetitions of fast-twitch contractions Relaxation: present  Neuromuscular Re-education: Supine hooklying, TrA activation with exhalation. VCs and TCs as needed to decrease compensatory patterns and minimize aggravation of the lumbar paraspinals. Supine hooklying, TrA activation with UE challenge. VCs and TCs as needed to decrease compensatory patterns Supine hooklying, TrA activation with LE march challenge. VCs and TCs as needed to decrease compensatory patterns Quick flick PFM contractions in  sidelying, 3 repetitions. Patient educated on reasoning for fast-twitch focus with duration of rest between reps.    Patient educated throughout session on appropriate technique and form using multi-modal cueing, HEP, and activity modification. Patient articulated understanding and returned demonstration.  Patient Response to interventions: Comfortable to trial new HEP  ASSESSMENT Patient presents to clinic with excellent motivation to participate in therapy. Patient demonstrates deficits in PFM strength, PFM coordination, IAP management, and posture. Patient able to achieve consistent and coordinated activation of TrA mm in supine posture during today's session and  responded positively to active interventions. Patient will benefit from continued skilled therapeutic intervention to address remaining deficits in PFM strength, PFM coordination, IAP management, and posture in order to increase function and improve overall QOL.     PT Long Term Goals - 01/10/21 1623       PT LONG TERM GOAL #1   Title Patient will demonstrate independence with HEP in order to maximize therapeutic gains and improve carryover from physical therapy sessions to ADLs in the home and community.    Baseline IE: not initiated    Time 12    Period Weeks    Status New    Target Date 04/04/21      PT LONG TERM GOAL #2   Title Patient will demonstrate circumferential and sequential contraction of >4/5 MMT, > 6 sec hold x10 and 5 consecutive quick flicks with </= 10 min rest between testing bouts, and relaxation of the PFM coordinated with breath for improved management of intra-abdominal pressure and normal bowel and bladder function without the presence of pain nor incontinence in order to improve participation at home and in the community.    Baseline IE: not demonstrated    Time 12    Period Weeks    Status New    Target Date 04/04/21      PT LONG TERM GOAL #3   Title Patient will report decreased reliance on protective undergarments as indicated by < 3/24 hour period to demonstrate improved bladder control and allow for increased participation in activities outside of the home.    Baseline IE: 4-6x    Time 12    Period Weeks    Status New    Target Date 04/04/21      PT LONG TERM GOAL #4   Title Patient will demonstrate improved function as evidenced by a score of <5 on IPSS measure with QoL rated <3 for full participation in activities at home and in the community.    Baseline IE: 9, 6 QoL    Time 12    Period Weeks    Status New    Target Date 04/04/21                   Plan - 01/21/21 1033     Clinical Impression Statement Patient presents to clinic  with excellent motivation to participate in therapy. Patient demonstrates deficits in PFM strength, PFM coordination, IAP management, and posture. Patient able to achieve consistent and coordinated activation of TrA mm in supine posture during today's session and responded positively to active interventions. Patient will benefit from continued skilled therapeutic intervention to address remaining deficits in PFM strength, PFM coordination, IAP management, and posture in order to increase function and improve overall QOL.    Personal Factors and Comorbidities Age;Comorbidity 3+;Past/Current Experience;Time since onset of injury/illness/exacerbation;Behavior Pattern    Comorbidities GERD, HTN, diastasis recti, arthritis, OSA    Examination-Activity Limitations Continence;Squat;Stand;Sit;Dressing;Transfers;Lift    Examination-Participation  Restrictions Community Activity;Laundry;Cleaning    Stability/Clinical Decision Making Evolving/Moderate complexity    Rehab Potential Fair    PT Frequency 1x / week    PT Duration 12 weeks    PT Treatment/Interventions ADLs/Self Care Home Management;Cryotherapy;Electrical Stimulation;Moist Heat;Therapeutic activities;Therapeutic exercise;Neuromuscular re-education;Patient/family education;Orthotic Fit/Training;Manual techniques;Scar mobilization;Taping;Spinal Manipulations;Joint Manipulations    PT Next Visit Plan progress deep core exercises    PT Home Exercise Plan [phone]    Consulted and Agree with Plan of Care Patient             Patient will benefit from skilled therapeutic intervention in order to improve the following deficits and impairments:  Decreased strength, Decreased coordination, Decreased activity tolerance, Improper body mechanics, Decreased endurance  Visit Diagnosis: Muscle weakness (generalized)  Other lack of coordination  Urinary incontinence, unspecified type     Problem List Patient Active Problem List   Diagnosis Date  Noted   BPH with obstruction/lower urinary tract symptoms 11/26/2020    Sheria Lang PT, DPT [phone]  01/21/2021, 1:47 PM  [place] [facility] [address] [place], [place], [address] Phone: [phone]   Fax:  [phone]  Name: Alejandro Hodges MRN: [id number] Date of Birth: 04-06-1955

## 2021-01-29 ENCOUNTER — Ambulatory Visit: Payer: Medicare Other | Attending: Urology | Admitting: Physical Therapy

## 2021-01-29 ENCOUNTER — Encounter: Payer: Self-pay | Admitting: Physical Therapy

## 2021-01-29 ENCOUNTER — Other Ambulatory Visit: Payer: Self-pay

## 2021-01-29 ENCOUNTER — Ambulatory Visit: Payer: Medicare Other | Admitting: Urology

## 2021-01-29 DIAGNOSIS — M6281 Muscle weakness (generalized): Secondary | ICD-10-CM | POA: Diagnosis present

## 2021-01-29 DIAGNOSIS — R32 Unspecified urinary incontinence: Secondary | ICD-10-CM | POA: Diagnosis present

## 2021-01-29 DIAGNOSIS — R278 Other lack of coordination: Secondary | ICD-10-CM | POA: Diagnosis present

## 2021-01-29 NOTE — Therapy (Signed)
[facility] [address] [place], [place], [address] Phone: [phone]   Fax:  [phone]  Physical Therapy Treatment  Patient Details  Name: Alejandro Hodges MRN: [id number] Date of Birth: 15-May-1955 Referring Provider (PT): [name]   Encounter Date: 01/29/2021   PT End of Session - 01/29/21 1419     Visit Number 3    Number of Visits 12    Date for PT Re-Evaluation 04/04/21    Authorization Type IE 01/10/2021    PT Start Time 0945    PT Stop Time 1025    PT Time Calculation (min) 40 min    Activity Tolerance Patient tolerated treatment well    Behavior During Therapy [facility] for tasks assessed/performed             Past Medical History:  Diagnosis Date   Arthritis    COVID-19    Diastasis recti    GERD (gastroesophageal reflux disease)    High frequency hearing loss    Hyperlipoproteinemia    Hypertension     Past Surgical History:  Procedure Laterality Date   COLONOSCOPY  2021   gingival graft     HERNIA REPAIR     HOLEP-LASER ENUCLEATION OF THE PROSTATE WITH MORCELLATION N/A 11/26/2020   Procedure: HOLEP-LASER ENUCLEATION OF THE PROSTATE WITH MORCELLATION;  Surgeon: [name], MD;  Location: [facility] ORS;  Service: Urology;  Laterality: N/A;   SEPTOPLASTY     VASECTOMY     WISDOM TOOTH EXTRACTION      There were no vitals filed for this visit.   Subjective Assessment - 01/29/21 0948     Subjective Patient states he feels things have been improving. He is no longer dealing with UI in the shower or when toweling off. Patient notes that his afternoon UI is variable depending on overall activity level of the day. Patient requesting review of previously assigned HEP from other PTs for clearance.    Currently in Pain? No/denies            TREATMENT  Neuromuscular Re-education: Standing postural interventions for improved deep core coordination: Serve a Tray, green theraband Hug a tree, green  theraband Chest Expansion, green theraband Reverse hug a tree, green theraband  Patient educated throughout session on appropriate technique and form using multi-modal cueing, HEP, and activity modification. Patient articulated understanding and returned demonstration.  Patient Response to interventions: Comfortable to practice new exercises  ASSESSMENT Patient presents to clinic with excellent motivation to participate in therapy. Patient demonstrates deficits in PFM strength, PFM coordination, IAP management, and posture. Patient able to coordinate standing postural/deep core interventions with moderate verbal and tactile cueing during today's session and responded positively to active interventions. Patient will benefit from continued skilled therapeutic intervention to address remaining deficits in PFM strength, PFM coordination, IAP management, and posture in order to increase function and improve overall QOL.   PT Long Term Goals - 01/10/21 1623       PT LONG TERM GOAL #1   Title Patient will demonstrate independence with HEP in order to maximize therapeutic gains and improve carryover from physical therapy sessions to ADLs in the home and community.    Baseline IE: not initiated    Time 12    Period Weeks    Status New    Target Date 04/04/21      PT LONG TERM GOAL #2   Title Patient will demonstrate circumferential and sequential contraction of >4/5 MMT, > 6 sec  hold x10 and 5 consecutive quick flicks with </= 10 min rest between testing bouts, and relaxation of the PFM coordinated with breath for improved management of intra-abdominal pressure and normal bowel and bladder function without the presence of pain nor incontinence in order to improve participation at home and in the community.    Baseline IE: not demonstrated    Time 12    Period Weeks    Status New    Target Date 04/04/21      PT LONG TERM GOAL #3   Title Patient will report decreased reliance on protective  undergarments as indicated by < 3/24 hour period to demonstrate improved bladder control and allow for increased participation in activities outside of the home.    Baseline IE: 4-6x    Time 12    Period Weeks    Status New    Target Date 04/04/21      PT LONG TERM GOAL #4   Title Patient will demonstrate improved function as evidenced by a score of <5 on IPSS measure with QoL rated <3 for full participation in activities at home and in the community.    Baseline IE: 9, 6 QoL    Time 12    Period Weeks    Status New    Target Date 04/04/21                   Plan - 01/29/21 1419     Clinical Impression Statement Patient presents to clinic with excellent motivation to participate in therapy. Patient demonstrates deficits in PFM strength, PFM coordination, IAP management, and posture. Patient able to coordinate standing postural/deep core interventions with moderate verbal and tactile cueing during today's session and responded positively to active interventions. Patient will benefit from continued skilled therapeutic intervention to address remaining deficits in PFM strength, PFM coordination, IAP management, and posture in order to increase function and improve overall QOL.    Personal Factors and Comorbidities Age;Comorbidity 3+;Past/Current Experience;Time since onset of injury/illness/exacerbation;Behavior Pattern    Comorbidities GERD, HTN, diastasis recti, arthritis, OSA    Examination-Activity Limitations Continence;Squat;Stand;Sit;Dressing;Transfers;Lift    Examination-Participation Restrictions Community Activity;Laundry;Cleaning    Stability/Clinical Decision Making Evolving/Moderate complexity    Rehab Potential Fair    PT Frequency 1x / week    PT Duration 12 weeks    PT Treatment/Interventions ADLs/Self Care Home Management;Cryotherapy;Electrical Stimulation;Moist Heat;Therapeutic activities;Therapeutic exercise;Neuromuscular re-education;Patient/family  education;Orthotic Fit/Training;Manual techniques;Scar mobilization;Taping;Spinal Manipulations;Joint Manipulations    PT Next Visit Plan progress deep core exercises    PT Home Exercise Plan [phone]    Consulted and Agree with Plan of Care Patient             Patient will benefit from skilled therapeutic intervention in order to improve the following deficits and impairments:  Decreased strength, Decreased coordination, Decreased activity tolerance, Improper body mechanics, Decreased endurance  Visit Diagnosis: Muscle weakness (generalized)  Other lack of coordination  Urinary incontinence, unspecified type     Problem List Patient Active Problem List   Diagnosis Date Noted   BPH with obstruction/lower urinary tract symptoms 11/26/2020    Sheria Lang PT, DPT [phone]  01/29/2021, 2:30 PM  [facility] [address] [place], [place], [address] Phone: [phone]   Fax:  [phone]  Name: Alejandro Hodges MRN: [id number] Date of Birth: [birth year]

## 2021-02-05 ENCOUNTER — Ambulatory Visit: Payer: Medicare Other | Admitting: Physical Therapy

## 2021-02-06 ENCOUNTER — Encounter: Payer: Federal, State, Local not specified - PPO | Admitting: Physical Therapy

## 2021-02-12 ENCOUNTER — Other Ambulatory Visit: Payer: Self-pay

## 2021-02-12 ENCOUNTER — Ambulatory Visit: Payer: Medicare Other | Admitting: Physical Therapy

## 2021-02-12 ENCOUNTER — Encounter: Payer: Self-pay | Admitting: Physical Therapy

## 2021-02-12 DIAGNOSIS — M6281 Muscle weakness (generalized): Secondary | ICD-10-CM

## 2021-02-12 DIAGNOSIS — R278 Other lack of coordination: Secondary | ICD-10-CM

## 2021-02-12 DIAGNOSIS — R32 Unspecified urinary incontinence: Secondary | ICD-10-CM

## 2021-02-12 NOTE — Therapy (Signed)
[facility] [address] [place], [place], [address] Phone: [phone]   Fax:  [phone]  Physical Therapy Treatment  Patient Details  Name: Alejandro Hodges MRN: [id number] Date of Birth: [birth year] Referring Provider (PT): [name]   Encounter Date: 02/12/2021   PT End of Session - 02/12/21 0907     Visit Number 4    Number of Visits 12    Date for PT Re-Evaluation 04/04/21    Authorization Type IE 01/10/2021    PT Start Time 0900    PT Stop Time 0940    PT Time Calculation (min) 40 min    Activity Tolerance Patient tolerated treatment well    Behavior During Therapy [facility] for tasks assessed/performed             Past Medical History:  Diagnosis Date   Arthritis    COVID-19    Diastasis recti    GERD (gastroesophageal reflux disease)    High frequency hearing loss    Hyperlipoproteinemia    Hypertension     Past Surgical History:  Procedure Laterality Date   COLONOSCOPY  2021   gingival graft     HERNIA REPAIR     HOLEP-LASER ENUCLEATION OF THE PROSTATE WITH MORCELLATION N/A 11/26/2020   Procedure: HOLEP-LASER ENUCLEATION OF THE PROSTATE WITH MORCELLATION;  Surgeon: [name], MD;  Location: [facility] ORS;  Service: Urology;  Laterality: N/A;   SEPTOPLASTY     VASECTOMY     WISDOM TOOTH EXTRACTION      There were no vitals filed for this visit.   Subjective Assessment - 02/12/21 0901     Subjective Patient reports that he has had some increased excitement as he welcomes guests for the weekend. Patient has some increased stress as well with spouse's health and follow-up with cardiologist this Thursday regarding holter monitor results. Patient notes that new standing exercises cause leakage if doing later in the day. Patient is 1x nocturia. Patient does note some interrupted sleep with L hip pain.    Currently in Pain? Yes    Pain Location Hip    Pain Orientation Left             TREATMENT International Prostate Symptom Score   How often have you had the sensation of not emptying your bladder? Not at All 0    How often have you had to urinate less than every two hours? Less than 1 in 5 times  1    How often have you found you stopped and started again several times when you urinated? Not at All  0    How often have you found it difficult to postpone urination? Less than half the time  2    How often have you had a weak urinary stream? Not at All  0    How often have you had to strain to start urination? Not at All  0    How many times did you typically get up at night to urinate?   1    Total IPSS Score  4   QOL 4-5 (mostly dissatisfied, unhappy) Manual Therapy: LAD of LLE for improved joint mobility and decreased pain Patient educated on purpose of intervention, expectations for response, and and signs/symptoms to report back at next visit for improved understanding of patient tolerance.  Neuromuscular Re-education: Standing postural interventions for improved deep core coordination: Serve a Tray, green theraband Hug a tree, green theraband Chest Expansion, green theraband Reverse hug  a tree, green theraband  Patient educated throughout session on appropriate technique and form using multi-modal cueing, HEP, and activity modification. Patient articulated understanding and returned demonstration.  Patient Response to interventions: Comfortable to continue with HEP   ASSESSMENT Patient presents to clinic with excellent motivation to participate in therapy. Patient demonstrates deficits in PFM strength, PFM coordination, IAP management, and posture. Patient with good form for standing postural/deep core interventions with minimal verbal and tactile cueing during today's session and responded positively to manual interventions. Patient will benefit from continued skilled therapeutic intervention to address remaining deficits in PFM strength, PFM coordination,  IAP management, and posture in order to increase function and improve overall QOL.    PT Long Term Goals - 01/10/21 1623       PT LONG TERM GOAL #1   Title Patient will demonstrate independence with HEP in order to maximize therapeutic gains and improve carryover from physical therapy sessions to ADLs in the home and community.    Baseline IE: not initiated    Time 12    Period Weeks    Status New    Target Date 04/04/21      PT LONG TERM GOAL #2   Title Patient will demonstrate circumferential and sequential contraction of >4/5 MMT, > 6 sec hold x10 and 5 consecutive quick flicks with </= 10 min rest between testing bouts, and relaxation of the PFM coordinated with breath for improved management of intra-abdominal pressure and normal bowel and bladder function without the presence of pain nor incontinence in order to improve participation at home and in the community.    Baseline IE: not demonstrated    Time 12    Period Weeks    Status New    Target Date 04/04/21      PT LONG TERM GOAL #3   Title Patient will report decreased reliance on protective undergarments as indicated by < 3/24 hour period to demonstrate improved bladder control and allow for increased participation in activities outside of the home.    Baseline IE: 4-6x    Time 12    Period Weeks    Status New    Target Date 04/04/21      PT LONG TERM GOAL #4   Title Patient will demonstrate improved function as evidenced by a score of <5 on IPSS measure with QoL rated <3 for full participation in activities at home and in the community.    Baseline IE: 9, 6 QoL    Time 12    Period Weeks    Status New    Target Date 04/04/21                   Plan - 02/12/21 0911     Clinical Impression Statement Patient presents to clinic with excellent motivation to participate in therapy. Patient demonstrates deficits in PFM strength, PFM coordination, IAP management, and posture. Patient with good form for standing  postural/deep core interventions with minimal verbal and tactile cueing during today's session and responded positively to manual interventions. Patient will benefit from continued skilled therapeutic intervention to address remaining deficits in PFM strength, PFM coordination, IAP management, and posture in order to increase function and improve overall QOL.    Personal Factors and Comorbidities Age;Comorbidity 3+;Past/Current Experience;Time since onset of injury/illness/exacerbation;Behavior Pattern    Comorbidities GERD, HTN, diastasis recti, arthritis, OSA    Examination-Activity Limitations Continence;Squat;Stand;Sit;Dressing;Transfers;Lift    Examination-Participation Restrictions Community Activity;Laundry;Cleaning    Stability/Clinical Decision Making Evolving/Moderate complexity  Rehab Potential Fair    PT Frequency 1x / week    PT Duration 12 weeks    PT Treatment/Interventions ADLs/Self Care Home Management;Cryotherapy;Electrical Stimulation;Moist Heat;Therapeutic activities;Therapeutic exercise;Neuromuscular re-education;Patient/family education;Orthotic Fit/Training;Manual techniques;Scar mobilization;Taping;Spinal Manipulations;Joint Manipulations    PT Next Visit Plan progress deep core exercises    PT Home Exercise Plan [phone]    Consulted and Agree with Plan of Care Patient             Patient will benefit from skilled therapeutic intervention in order to improve the following deficits and impairments:  Decreased strength, Decreased coordination, Decreased activity tolerance, Improper body mechanics, Decreased endurance  Visit Diagnosis: Muscle weakness (generalized)  Other lack of coordination  Urinary incontinence, unspecified type     Problem List Patient Active Problem List   Diagnosis Date Noted   BPH with obstruction/lower urinary tract symptoms 11/26/2020    Sheria Lang PT, DPT [phone]  02/12/2021, 9:57 AM  [facility] [address] [place], [place], [address] Phone: [phone]   Fax:  [phone]  Name: Yasmin Dibello MRN: [id number] Date of Birth: 1956/01/11

## 2021-02-20 ENCOUNTER — Encounter: Payer: Federal, State, Local not specified - PPO | Admitting: Physical Therapy

## 2021-02-22 NOTE — Telephone Encounter (Signed)
Alejandro Ching, PA-C to Alejandro Hodges, CMA A bit unusual for him to still be passing material this far out from surgery. Can you please put him on Dr. Delana Meyer schedule in the next couple of weeks to discuss? Will defer to her judgment on cysto.   Appointment booked for 1/13 in [place], patient confirmed.

## 2021-03-01 ENCOUNTER — Encounter: Payer: Self-pay | Admitting: Physical Therapy

## 2021-03-01 ENCOUNTER — Ambulatory Visit: Payer: Medicare Other | Attending: Urology | Admitting: Physical Therapy

## 2021-03-01 ENCOUNTER — Other Ambulatory Visit: Payer: Self-pay

## 2021-03-01 DIAGNOSIS — R278 Other lack of coordination: Secondary | ICD-10-CM | POA: Insufficient documentation

## 2021-03-01 DIAGNOSIS — R32 Unspecified urinary incontinence: Secondary | ICD-10-CM | POA: Diagnosis present

## 2021-03-01 DIAGNOSIS — M6281 Muscle weakness (generalized): Secondary | ICD-10-CM | POA: Insufficient documentation

## 2021-03-01 NOTE — Therapy (Signed)
[facility] [address] [place], [place], [address] Phone: [phone]   Fax:  [phone]  Physical Therapy Treatment  Patient Details  Name: Alejandro Hodges MRN: [id number] Date of Birth: October 06, 1955 Referring Provider (PT): [name]   Encounter Date: 03/01/2021   PT End of Session - 03/01/21 0845     Visit Number 5    Number of Visits 12    Date for PT Re-Evaluation 04/04/21    Authorization Type IE 01/10/2021    PT Start Time 0845    PT Stop Time 0930    PT Time Calculation (min) 45 min    Activity Tolerance Patient tolerated treatment well    Behavior During Therapy [facility] for tasks assessed/performed             Past Medical History:  Diagnosis Date   Arthritis    COVID-19    Diastasis recti    GERD (gastroesophageal reflux disease)    High frequency hearing loss    Hyperlipoproteinemia    Hypertension     Past Surgical History:  Procedure Laterality Date   COLONOSCOPY  2021   gingival graft     HERNIA REPAIR     HOLEP-LASER ENUCLEATION OF THE PROSTATE WITH MORCELLATION N/A 11/26/2020   Procedure: HOLEP-LASER ENUCLEATION OF THE PROSTATE WITH MORCELLATION;  Surgeon: [name], MD;  Location: [facility] ORS;  Service: Urology;  Laterality: N/A;   SEPTOPLASTY     VASECTOMY     WISDOM TOOTH EXTRACTION      There were no vitals filed for this visit.   Subjective Assessment - 03/01/21 0846     Subjective Patient notes that with increased demands/stressors of the holidays he has been less consistent with his exercises. He will be following up with urology regarding recent passage of scab material. Patient is not expressly concerned about this, and notes no other concerning symptoms. Patient notes that he did see a couple spots of what looked like blood for about 24 hours after.    Currently in Pain? No/denies             TREATMENT  Neuromuscular Re-education: Reassessed goals; see below.   Patient  educated throughout session on appropriate technique and form using multi-modal cueing, HEP, and activity modification. Patient articulated understanding and returned demonstration.  Patient Response to interventions: Comfortable to continue with HEP   ASSESSMENT Patient presents to clinic with excellent motivation to participate in therapy. Patient demonstrates deficits in PFM strength, PFM coordination, IAP management, and posture. Patient indicating progress towards goals with decreased reliance on protective undergarments, improved PFM strength and endurance, and IPSS score (noted below) during today's session and responded positively to active interventions. Patient will benefit from continued skilled therapeutic intervention to address remaining deficits in PFM strength, PFM coordination, IAP management, and posture in order to increase function and improve overall QOL.    PT Long Term Goals - 03/01/21 0901       PT LONG TERM GOAL #1   Title Patient will demonstrate independence with HEP in order to maximize therapeutic gains and improve carryover from physical therapy sessions to ADLs in the home and community.    Baseline IE: not initiated; 1/6: 80%    Time 12    Period Weeks    Status On-going    Target Date 04/04/21      PT LONG TERM GOAL #2   Title Patient will demonstrate circumferential and sequential contraction of >4/5 MMT, > 6  sec hold x10 and 5 consecutive quick flicks with </= 10 min rest between testing bouts, and relaxation of the PFM coordinated with breath for improved management of intra-abdominal pressure and normal bowel and bladder function without the presence of pain nor incontinence in order to improve participation at home and in the community.    Baseline IE: not demonstrated; 1/6: 3/5 MMT, 5 sec x5, 6 quick flicks    Time 12    Period Weeks    Status On-going    Target Date 04/04/21      PT LONG TERM GOAL #3   Title Patient will report decreased reliance  on protective undergarments as indicated by < 3/24 hour period to demonstrate improved bladder control and allow for increased participation in activities outside of the home.    Baseline IE: 4-6x; 1/6: 1-2/day (pads)    Time 12    Period Weeks    Status Achieved    Target Date 04/04/21      PT LONG TERM GOAL #4   Title Patient will demonstrate improved function as evidenced by a score of <5 on IPSS measure with QoL rated <3 for full participation in activities at home and in the community.    Baseline IE: 9, 6 QoL; 12/20: 4, 5 QoL    Time 12    Period Weeks    Status On-going    Target Date 04/04/21                   Plan - 03/01/21 0846     Clinical Impression Statement Patient presents to clinic with excellent motivation to participate in therapy. Patient demonstrates deficits in PFM strength, PFM coordination, IAP management, and posture. Patient indicating progress towards goals with decreased reliance on protective undergarments, improved PFM strength and endurance, and IPSS score (noted below) during today's session and responded positively to active interventions. Patient will benefit from continued skilled therapeutic intervention to address remaining deficits in PFM strength, PFM coordination, IAP management, and posture in order to increase function and improve overall QOL.    Personal Factors and Comorbidities Age;Comorbidity 3+;Past/Current Experience;Time since onset of injury/illness/exacerbation;Behavior Pattern    Comorbidities GERD, HTN, diastasis recti, arthritis, OSA    Examination-Activity Limitations Continence;Squat;Stand;Sit;Dressing;Transfers;Lift    Examination-Participation Restrictions Community Activity;Laundry;Cleaning    Stability/Clinical Decision Making Evolving/Moderate complexity    Rehab Potential Fair    PT Frequency 1x / week    PT Duration 12 weeks    PT Treatment/Interventions ADLs/Self Care Home Management;Cryotherapy;Electrical  Stimulation;Moist Heat;Therapeutic activities;Therapeutic exercise;Neuromuscular re-education;Patient/family education;Orthotic Fit/Training;Manual techniques;Scar mobilization;Taping;Spinal Manipulations;Joint Manipulations    PT Next Visit Plan progress deep core exercises    PT Home Exercise Plan [phone]    Consulted and Agree with Plan of Care Patient             Patient will benefit from skilled therapeutic intervention in order to improve the following deficits and impairments:  Decreased strength, Decreased coordination, Decreased activity tolerance, Improper body mechanics, Decreased endurance  Visit Diagnosis: Muscle weakness (generalized)  Other lack of coordination  Urinary incontinence, unspecified type     Problem List Patient Active Problem List   Diagnosis Date Noted   BPH with obstruction/lower urinary tract symptoms 11/26/2020    Sheria Lang PT, DPT [phone]  03/01/2021, 10:43 AM  [facility] [address] [place], [place], [address] Phone: [phone]   Fax:  [phone]  Name: Gildardo Tickner MRN: [id number] Date of Birth: [birth year]

## 2021-03-05 NOTE — Progress Notes (Signed)
03/08/21 9:19 AM   Alejandro Hodges 12-Feb-1956 [id number]  Referring provider:  Aundria Mems, MD [place],  [place] [address] Chief Complaint  Patient presents with   Benign Prostatic Hypertrophy    HPI: Alejandro Hodges is a 66 y.o.male with a personal history of BPH with BOO  ,who presents today for discussion of cystoscopy.   He underwent a HoLEP on 11/26/2020. Intraoperative findings showed significant trilobar coaptation and trabeculated bladder.  Pathology revealed stromal and glandular hyperplasia. 153 gm were resected. Post op course complicated by hematuria requiring overnight CBI.  He reported via Mychart message that he is passing material.  This happened on Christmas eve.  He went to the restroom in the middle the void, had a large piece of tissue passed.  He took a picture brings it with him today.  He voided another 100 cc after this.  He denied any associated urinary symptoms of the time including no dysuria gross hematuria.  Will sometimes get a twinge in his prostate area but this is not as bothersome.  He continues work with physical therapy.  His incontinence is minimal.  He is made a lot of progress since Thanksgiving per his report.  IPSS as below.  He is very pleased with the result.   IPSS     Row Name 03/08/21 0900         International Prostate Symptom Score   How often have you had the sensation of not emptying your bladder? Less than 1 in 5     How often have you had to urinate less than every two hours? Less than half the time     How often have you found you stopped and started again several times when you urinated? Not at All     How often have you found it difficult to postpone urination? Less than 1 in 5 times     How often have you had a weak urinary stream? Not at All     How often have you had to strain to start urination? Not at All     How many times did you typically get up at night to urinate? 1 Time     Total IPSS Score 5        Quality of Life due to urinary symptoms   If you were to spend the rest of your life with your urinary condition just the way it is now how would you feel about that? Mostly Satisfied              Score:  1-7 Mild 8-19 Moderate 20-35 Severe     PMH: Past Medical History:  Diagnosis Date   Arthritis    COVID-19    Diastasis recti    GERD (gastroesophageal reflux disease)    High frequency hearing loss    Hyperlipoproteinemia    Hypertension     Surgical History: Past Surgical History:  Procedure Laterality Date   COLONOSCOPY  2021   gingival graft     HERNIA REPAIR     HOLEP-LASER ENUCLEATION OF THE PROSTATE WITH MORCELLATION N/A 11/26/2020   Procedure: HOLEP-LASER ENUCLEATION OF THE PROSTATE WITH MORCELLATION;  Surgeon: Hollice Espy, MD;  Location: [facility] ORS;  Service: Urology;  Laterality: N/A;   SEPTOPLASTY     VASECTOMY     WISDOM TOOTH EXTRACTION      Home Medications:  Allergies as of 03/08/2021       Reactions   Ciprofloxacin Other (See Comments)  Myalgias (Muscle Pain), tendonitis   Meloxicam Other (See Comments)   Increased urine frequency    Naproxen    headaches   Nsaids    Upset stomach        Medication List        Accurate as of March 08, 2021  9:19 AM. If you have any questions, ask your nurse or doctor.          STOP taking these medications    docusate sodium 100 MG capsule Commonly known as: COLACE Stopped by: Hollice Espy, MD   lisinopril 10 MG tablet Commonly known as: ZESTRIL Stopped by: Hollice Espy, MD   PROSTATE SUPPORT PO Stopped by: Hollice Espy, MD       TAKE these medications    amLODipine-benazepril 5-10 MG capsule Commonly known as: LOTREL Take 1 capsule by mouth daily.   Cholecalciferol 25 MCG (1000 UT) capsule Take 1,000 Units by mouth daily.   Eliquis 5 MG Tabs tablet Generic drug: apixaban Take 5 mg by mouth 2 (two) times daily.   metoprolol succinate 25 MG 24 hr  tablet Commonly known as: TOPROL-XL Take 1 tablet by mouth daily.   PSYLLIUM PO Take 1 Dose by mouth 2 (two) times daily. 1 dose = 2 tablespoons        Allergies:  Allergies  Allergen Reactions   Ciprofloxacin Other (See Comments)    Myalgias (Muscle Pain), tendonitis    Meloxicam Other (See Comments)    Increased urine frequency    Naproxen     headaches   Nsaids     Upset stomach    Family History: Family History  Problem Relation Age of Onset   Colon cancer Mother    Colon cancer Paternal Grandmother    Bladder Cancer Neg Hx    Prostate cancer Neg Hx    Kidney cancer Neg Hx    Testicular cancer Neg Hx     Social History:  reports that he has never smoked. He has never used smokeless tobacco. He reports that he does not currently use alcohol. He reports that he does not use drugs.   Physical Exam: BP (!) 147/89    Pulse 71    Ht 5\' 9"  (1.753 m)    Wt 189 lb (85.7 kg)    BMI 27.91 kg/m   Constitutional:  Alert and oriented, No acute distress. HEENT: [place] AT, moist mucus membranes.  Trachea midline, no masses. Cardiovascular: No clubbing, cyanosis, or edema. Respiratory: Normal respiratory effort, no increased work of breathing. Skin: No rashes, bruises or suspicious lesions. Neurologic: Grossly intact, no focal deficits, moving all 4 extremities. Psychiatric: Normal mood and affect.  Urinalysis Pending  Pertinent Imaging: Results for orders placed or performed in visit on 03/08/21  BLADDER SCAN AMB NON-IMAGING  Result Value Ref Range   Scan Result 70 ml     Assessment & Plan:    1. Benign prostatic hyperplasia with urinary retention Overall dramatic improvement status post holep  Improve IPSS was adequate emptying today - Urinalysis, Complete w Microscopic (For [facility] ONLY); Future - BLADDER SCAN AMB NON-IMAGING  2. Stress incontinence Continue work with physical therapy - BLADDER SCAN AMB NON-IMAGING  3. Foreign body in bladder,  sequela Suspect this was a necrotic piece of tissue, likely hanging From the bladder neck versus retained tissue and or sloughing  He is fairly asymptomatic we will check a urine to rule out infection.  If his urinalysis is unusual or suspicious, may consider cystoscopy.  Is agreeable this plan.  Follow-up in May as scheduled  Conley Rolls as a scribe for Hollice Espy, MD.,have documented all relevant documentation on the behalf of Hollice Espy, MD,as directed by  Hollice Espy, MD while in the presence of Hollice Espy, [place] [address] [place], [place] [address] [phone]

## 2021-03-08 ENCOUNTER — Other Ambulatory Visit
Admission: RE | Admit: 2021-03-08 | Discharge: 2021-03-08 | Disposition: A | Discharge status: Home / Self Care | Payer: Medicare Other | Source: Ambulatory Visit | Attending: Urology | Admitting: Urology

## 2021-03-08 ENCOUNTER — Ambulatory Visit (INDEPENDENT_AMBULATORY_CARE_PROVIDER_SITE_OTHER): Payer: Medicare Other | Admitting: Urology

## 2021-03-08 ENCOUNTER — Other Ambulatory Visit: Payer: Self-pay

## 2021-03-08 VITALS — BP 147/89 | HR 71 | Ht 69.0 in | Wt 189.0 lb

## 2021-03-08 DIAGNOSIS — T191XXS Foreign body in bladder, sequela: Secondary | ICD-10-CM

## 2021-03-08 DIAGNOSIS — R338 Other retention of urine: Secondary | ICD-10-CM | POA: Insufficient documentation

## 2021-03-08 DIAGNOSIS — N401 Enlarged prostate with lower urinary tract symptoms: Secondary | ICD-10-CM | POA: Diagnosis present

## 2021-03-08 DIAGNOSIS — N393 Stress incontinence (female) (male): Secondary | ICD-10-CM | POA: Diagnosis not present

## 2021-03-08 LAB — URINALYSIS, COMPLETE (UACMP) WITH MICROSCOPIC
Bilirubin Urine: NEGATIVE
Glucose, UA: NEGATIVE mg/dL
Hgb urine dipstick: NEGATIVE
Ketones, ur: NEGATIVE mg/dL
Leukocytes,Ua: NEGATIVE
Nitrite: NEGATIVE
Protein, ur: NEGATIVE mg/dL
Specific Gravity, Urine: 1.015 (ref 1.005–1.030)
pH: 7 (ref 5.0–8.0)

## 2021-03-08 LAB — BLADDER SCAN AMB NON-IMAGING: Scan Result: 70

## 2021-03-21 ENCOUNTER — Encounter: Payer: Self-pay | Admitting: Physical Therapy

## 2021-03-27 ENCOUNTER — Encounter: Payer: Self-pay | Admitting: Physical Therapy

## 2021-03-27 ENCOUNTER — Ambulatory Visit: Payer: Medicare Other | Attending: Urology | Admitting: Physical Therapy

## 2021-03-27 ENCOUNTER — Other Ambulatory Visit: Payer: Self-pay

## 2021-03-27 DIAGNOSIS — M6281 Muscle weakness (generalized): Secondary | ICD-10-CM | POA: Insufficient documentation

## 2021-03-27 DIAGNOSIS — R278 Other lack of coordination: Secondary | ICD-10-CM | POA: Insufficient documentation

## 2021-03-27 DIAGNOSIS — R32 Unspecified urinary incontinence: Secondary | ICD-10-CM | POA: Diagnosis present

## 2021-03-27 NOTE — Therapy (Signed)
[place] [facility] [address] [place], [place], [address] Phone: [phone]   Fax:  [phone]  Physical Therapy Treatment  Patient Details  Name: Alejandro Hodges MRN: [id number] Date of Birth: 07-May-1955 Referring Provider (PT): [name]   Encounter Date: 03/27/2021   PT End of Session - 03/27/21 0917     Visit Number 6    Number of Visits 12    Date for PT Re-Evaluation 04/04/21    Authorization Type IE 01/10/2021    PT Start Time 0900    PT Stop Time 0940    PT Time Calculation (min) 40 min    Activity Tolerance Patient tolerated treatment well    Behavior During Therapy [facility] for tasks assessed/performed             Past Medical History:  Diagnosis Date   Arthritis    COVID-19    Diastasis recti    GERD (gastroesophageal reflux disease)    High frequency hearing loss    Hyperlipoproteinemia    Hypertension     Past Surgical History:  Procedure Laterality Date   COLONOSCOPY  2021   gingival graft     HERNIA REPAIR     HOLEP-LASER ENUCLEATION OF THE PROSTATE WITH MORCELLATION N/A 11/26/2020   Procedure: HOLEP-LASER ENUCLEATION OF THE PROSTATE WITH MORCELLATION;  Surgeon: [name], MD;  Location: [facility] ORS;  Service: Urology;  Laterality: N/A;   SEPTOPLASTY     VASECTOMY     WISDOM TOOTH EXTRACTION      There were no vitals filed for this visit.   Subjective Assessment - 03/27/21 0904     Subjective Patient had f/u with urology and it was determined that the material passed was likely prostate tissue. No procedures or follow up testing at this time. Patient is now also dealing with his wife's medical appointments. Patient notes that UI continues to improve; patient notes that UI is typically later in the day, but does have some post-void dribble that occurs with nocturia. Patient notes continued UI with flatus about 25% of the time.    Currently in Pain? No/denies             TREATMENT  Neuromuscular Re-education: Counter supported deep core/postural series:  Plank with TrA activation  Plank with TrA activation and UE reach  Plank with TrA activation and hip extension  Plank with TrA activation bird-dog Patient education on strategies to isolate puborectalis vs. iliococcygeus   Patient educated throughout session on appropriate technique and form using multi-modal cueing, HEP, and activity modification. Patient articulated understanding and returned demonstration.  Patient Response to interventions: Comfortable with new exercises; returning in 4-6 weeks.   ASSESSMENT Patient presents to clinic with excellent motivation to participate in therapy. Patient demonstrates deficits in PFM strength, PFM coordination, IAP management, and posture. Patient able to perform TrA coordination/control interventions against gravity with good form during today's session and responded positively to active interventions. Patient will benefit from continued skilled therapeutic intervention to address remaining deficits in PFM strength, PFM coordination, IAP management, and posture in order to increase function and improve overall QOL.    PT Long Term Goals - 03/01/21 0901       PT LONG TERM GOAL #1   Title Patient will demonstrate independence with HEP in order to maximize therapeutic gains and improve carryover from physical therapy sessions to ADLs in the home and community.    Baseline IE: not initiated; 1/6: 80%    Time  12    Period Weeks    Status On-going    Target Date 04/04/21      PT LONG TERM GOAL #2   Title Patient will demonstrate circumferential and sequential contraction of >4/5 MMT, > 6 sec hold x10 and 5 consecutive quick flicks with </= 10 min rest between testing bouts, and relaxation of the PFM coordinated with breath for improved management of intra-abdominal pressure and normal bowel and bladder function without the presence of pain nor incontinence in  order to improve participation at home and in the community.    Baseline IE: not demonstrated; 1/6: 3/5 MMT, 5 sec x5, 6 quick flicks    Time 12    Period Weeks    Status On-going    Target Date 04/04/21      PT LONG TERM GOAL #3   Title Patient will report decreased reliance on protective undergarments as indicated by < 3/24 hour period to demonstrate improved bladder control and allow for increased participation in activities outside of the home.    Baseline IE: 4-6x; 1/6: 1-2/day (pads)    Time 12    Period Weeks    Status Achieved    Target Date 04/04/21      PT LONG TERM GOAL #4   Title Patient will demonstrate improved function as evidenced by a score of <5 on IPSS measure with QoL rated <3 for full participation in activities at home and in the community.    Baseline IE: 9, 6 QoL; 12/20: 4, 5 QoL    Time 12    Period Weeks    Status On-going    Target Date 04/04/21                   Plan - 03/27/21 2637     Clinical Impression Statement Patient presents to clinic with excellent motivation to participate in therapy. Patient demonstrates deficits in PFM strength, PFM coordination, IAP management, and posture. Patient able to perform TrA coordination/control interventions against gravity with good form during today's session and responded positively to active interventions. Patient will benefit from continued skilled therapeutic intervention to address remaining deficits in PFM strength, PFM coordination, IAP management, and posture in order to increase function and improve overall QOL.    Personal Factors and Comorbidities Age;Comorbidity 3+;Past/Current Experience;Time since onset of injury/illness/exacerbation;Behavior Pattern    Comorbidities GERD, HTN, diastasis recti, arthritis, OSA    Examination-Activity Limitations Continence;Squat;Stand;Sit;Dressing;Transfers;Lift    Examination-Participation Restrictions Community Activity;Laundry;Cleaning    Stability/Clinical  Decision Making Evolving/Moderate complexity    Rehab Potential Fair    PT Frequency 1x / week    PT Duration 12 weeks    PT Treatment/Interventions ADLs/Self Care Home Management;Cryotherapy;Electrical Stimulation;Moist Heat;Therapeutic activities;Therapeutic exercise;Neuromuscular re-education;Patient/family education;Orthotic Fit/Training;Manual techniques;Scar mobilization;Taping;Spinal Manipulations;Joint Manipulations    PT Next Visit Plan progress deep core exercises    PT Home Exercise Plan [phone]    Consulted and Agree with Plan of Care Patient             Patient will benefit from skilled therapeutic intervention in order to improve the following deficits and impairments:  Decreased strength, Decreased coordination, Decreased activity tolerance, Improper body mechanics, Decreased endurance  Visit Diagnosis: Muscle weakness (generalized)  Other lack of coordination  Urinary incontinence, unspecified type     Problem List Patient Active Problem List   Diagnosis Date Noted   BPH with obstruction/lower urinary tract symptoms 11/26/2020    Sheria Lang PT, DPT [phone]  03/27/2021, 2:56 PM  [facility] [address] [place], [place], [address] Phone: [phone]   Fax:  [phone]  Name: Judithann GravesDavid Passero MRN: [id number] Date of Birth: Jun 27, 1955

## 2021-05-07 ENCOUNTER — Other Ambulatory Visit: Payer: Self-pay

## 2021-05-07 ENCOUNTER — Ambulatory Visit: Payer: Medicare Other | Attending: Urology | Admitting: Physical Therapy

## 2021-05-07 DIAGNOSIS — R32 Unspecified urinary incontinence: Secondary | ICD-10-CM | POA: Diagnosis present

## 2021-05-07 DIAGNOSIS — M6281 Muscle weakness (generalized): Secondary | ICD-10-CM | POA: Insufficient documentation

## 2021-05-07 DIAGNOSIS — R278 Other lack of coordination: Secondary | ICD-10-CM | POA: Insufficient documentation

## 2021-05-07 NOTE — Therapy (Signed)
[place] [facility] ?[address] [place], [place], [address] ?Phone: [phone]   Fax:  [phone] ? ?Physical Therapy Treatment ? ?Patient Details  ?Name: Alejandro Hodges ?MRN: [id number] ?Date of Birth: [birth year] ?Referring Provider (PT): [name] ? ? ?Encounter Date: 05/07/2021 ? ? PT End of Session - 05/07/21 1144   ? ? Visit Number 7   ? Number of Visits 20   ? Date for PT Re-Evaluation 07/02/21   ? Authorization Type IE 01/10/2021   ? PT Start Time 0945   ? PT Stop Time 1030   ? PT Time Calculation (min) 45 min   ? Activity Tolerance Patient tolerated treatment well   ? Behavior During Therapy [facility] for tasks assessed/performed   ? ?  ?  ? ?  ? ? ?Past Medical History:  ?Diagnosis Date  ? Arthritis   ? COVID-19   ? Diastasis recti   ? GERD (gastroesophageal reflux disease)   ? High frequency hearing loss   ? Hyperlipoproteinemia   ? Hypertension   ? ? ?Past Surgical History:  ?Procedure Laterality Date  ? COLONOSCOPY  2021  ? gingival graft    ? HERNIA REPAIR    ? HOLEP-LASER ENUCLEATION OF THE PROSTATE WITH MORCELLATION N/A 11/26/2020  ? Procedure: HOLEP-LASER ENUCLEATION OF THE PROSTATE WITH MORCELLATION;  Surgeon: [name], MD;  Location: [facility] ORS;  Service: Urology;  Laterality: N/A;  ? SEPTOPLASTY    ? VASECTOMY    ? WISDOM TOOTH EXTRACTION    ? ? ?There were no vitals filed for this visit. ? ? Subjective Assessment - 05/07/21 0947   ? ? Subjective Patient reports that his UI is almost completely resolved but he will on occasion still deal with random leakage. Patient notes that he feels he has improved 97-98% since starting PT.   ? Currently in Pain? No/denies   ? ?  ?  ? ?  ? ?TREATMENT ? ?Neuromuscular Re-education: ?Reassessed goals; see below.  ? ?Patient educated throughout session on appropriate technique and form using multi-modal cueing, HEP, and activity modification. Patient articulated understanding and returned demonstration. ? ?Patient  Response to interventions: ?Interested in following up after next visit with urology. ? ?ASSESSMENT ?Patient presents to clinic with excellent motivation to participate in therapy. Patient demonstrates minimal remaining deficits in PFM strength, PFM coordination, IAP management, and posture. Patient indicating goal achievement in all areas as set forth at the start of therapy, but does still with rare/occasional occurrence of UI and is interested in following-up after his next urology appointment to ensure continued success. Patient states he has achieved 97-98% improvement since that start of physical therapy. Patient has responded positively to educational and active interventions throughout the course of care. Patient may benefit from continued skilled therapeutic intervention to should any deficits in PFM strength, PFM coordination, IAP management, and posture re-emerge in order to increase function and improve overall QOL. ? ? ? PT Long Term Goals - 05/07/21 0959   ? ?  ? PT LONG TERM GOAL #1  ? Title Patient will demonstrate independence with HEP in order to maximize therapeutic gains and improve carryover from physical therapy sessions to ADLs in the home and community.   ? Baseline IE: not initiated; 1/6: 80%; 3/14: IND   ? Time 12   ? Period Weeks   ? Status Achieved   ? Target Date 04/04/21   ?  ? PT LONG TERM GOAL #2  ? Title Patient will  demonstrate circumferential and sequential contraction of >4/5 MMT, > 6 sec hold x10 and 5 consecutive quick flicks with </= 10 min rest between testing bouts, and relaxation of the PFM coordinated with breath for improved management of intra-abdominal pressure and normal bowel and bladder function without the presence of pain nor incontinence in order to improve participation at home and in the community.   ? Baseline IE: not demonstrated; 1/6: 3/5 MMT, 5 sec x5, 6 quick flicks; 3/14: 4/5 MMT, 10 quick flicks, 7 sec hold x10   ? Time 12   ? Period Weeks   ? Status  Achieved   ? Target Date 04/04/21   ?  ? PT LONG TERM GOAL #3  ? Title Patient will report decreased reliance on protective undergarments as indicated by < 3/24 hour period to demonstrate improved bladder control and allow for increased participation in activities outside of the home.   ? Baseline IE: 4-6x; 1/6: 1-2/day (pads); 3/14: 1/day   ? Time 12   ? Period Weeks   ? Status Achieved   ? Target Date 04/04/21   ?  ? PT LONG TERM GOAL #4  ? Title Patient will demonstrate improved function as evidenced by a score of <5 on IPSS measure with QoL rated <3 for full participation in activities at home and in the community.   ? Baseline IE: 9, 6 QoL; 12/20: 4, 5 QoL; 3/14: 4, 0.5 QoL   ? Time 12   ? Period Weeks   ? Status Achieved   ? Target Date 04/04/21   ? ?  ?  ? ?  ? ? ? ? ? ? ? ? Plan - 05/07/21 1144   ? ? Clinical Impression Statement Patient presents to clinic with excellent motivation to participate in therapy. Patient demonstrates minimal remaining deficits in PFM strength, PFM coordination, IAP management, and posture. Patient indicating goal achievement in all areas as set forth at the start of therapy, but does still with rare/occasional occurrence of UI and is interested in following-up after his next urology appointment to ensure continued success. Patient states he has achieved 97-98% improvement since that start of physical therapy. Patient has responded positively to educational and active interventions throughout the course of care. Patient may benefit from continued skilled therapeutic intervention to should any deficits in PFM strength, PFM coordination, IAP management, and posture re-emerge in order to increase function and improve overall QOL.   ? Personal Factors and Comorbidities Age;Comorbidity 3+;Past/Current Experience;Time since onset of injury/illness/exacerbation;Behavior Pattern   ? Comorbidities GERD, HTN, diastasis recti, arthritis, OSA   ? Examination-Activity Limitations  Continence;Squat;Stand;Sit;Dressing;Transfers;Lift   ? Examination-Participation Restrictions Community Activity;Laundry;Cleaning   ? Stability/Clinical Decision Making Evolving/Moderate complexity   ? Rehab Potential Fair   ? PT Frequency 1x / week   ? PT Duration 12 weeks   ? PT Treatment/Interventions ADLs/Self Care Home Management;Cryotherapy;Electrical Stimulation;Moist Heat;Therapeutic activities;Therapeutic exercise;Neuromuscular re-education;Patient/family education;Orthotic Fit/Training;Manual techniques;Scar mobilization;Taping;Spinal Manipulations;Joint Manipulations   ? PT Next Visit Plan reassess   ? PT Home Exercise Plan [id number]   ? Consulted and Agree with Plan of Care Patient   ? ?  ?  ? ?  ? ? ?Patient will benefit from skilled therapeutic intervention in order to improve the following deficits and impairments:  Decreased strength, Decreased coordination, Decreased activity tolerance, Improper body mechanics, Decreased endurance ? ?Visit Diagnosis: ?Muscle weakness (generalized) ? ?Other lack of coordination ? ?Urinary incontinence, unspecified type ? ? ? ? ?Problem List ?Patient Active Problem List  ?  Diagnosis Date Noted  ? BPH with obstruction/lower urinary tract symptoms 11/26/2020  ? ?Sheria Lang PT, DPT [phone]  ?05/07/2021, 11:51 AM ? ?[place] ?[facility] ?[address] [place], [place], [address] ?Phone: [phone]   Fax:  [phone] ? ?Name: Alejandro Hodges ?MRN: [id number] ?Date of Birth: 07-29-1955 ? ? ? ?

## 2021-05-07 NOTE — Addendum Note (Signed)
Addended by: Kathryne Eriksson on: 05/07/2021 11:53 AM ? ? Modules accepted: Orders ? ?

## 2021-06-27 ENCOUNTER — Other Ambulatory Visit: Payer: Medicare Other

## 2021-06-27 DIAGNOSIS — R972 Elevated prostate specific antigen [PSA]: Secondary | ICD-10-CM

## 2021-06-28 LAB — PSA: Prostate Specific Ag, Serum: 0.8 ng/mL (ref 0.0–4.0)

## 2021-07-02 ENCOUNTER — Ambulatory Visit (INDEPENDENT_AMBULATORY_CARE_PROVIDER_SITE_OTHER): Payer: Medicare Other | Admitting: Urology

## 2021-07-02 VITALS — BP 143/92 | HR 69 | Ht 68.0 in | Wt 190.0 lb

## 2021-07-02 DIAGNOSIS — N393 Stress incontinence (female) (male): Secondary | ICD-10-CM

## 2021-07-02 DIAGNOSIS — R338 Other retention of urine: Secondary | ICD-10-CM | POA: Diagnosis not present

## 2021-07-02 DIAGNOSIS — N401 Enlarged prostate with lower urinary tract symptoms: Secondary | ICD-10-CM

## 2021-07-02 NOTE — Progress Notes (Signed)
? ?07/02/21 ?2:11 PM  ? ?Alejandro Hodges ?28-Feb-1955 ?[id number] ? ?Referring provider:  ?Aundria Mems, MD ?[address] ?[place],  [place] [address] ?Chief Complaint  ?Patient presents with  ? Benign Prostatic Hypertrophy  ? ? ? ?HPI: ?Alejandro Hodges is a 66 y.o.male with a personal history of BPH with BOO, who presents today for a 6 month follow-up with IPSS and prior PSA.  ? ?He underwent a HoLEP on 11/26/2020. Intraoperative findings showed significant trilobar coaptation and trabeculated bladder.  Pathology revealed stromal and glandular hyperplasia. 153 gm were resected. Post op course complicated by hematuria requiring overnight CBI. ?  ?He was seen in clinic on 03/08/2021 he was noted to have passed some tissue mater when voiding. This was suspected to be necrotic piece of tissues likely from bladder neck versus retained tissue and or sloughing.  ? ?His most recent PSA was 0.8 on 06/27/2021.  ? ?He is accompanied by his wife he reports that he occasionally has leakage which is rare.. He has improvement with physical therapy. He is planning a vacation with his wife but had to be canceled last year around the time of surgery. ? ?Overall, he is extremely pleased with his urinary status. ? ? ? IPSS   ? ? [place] Name 07/02/21 1300  ?  ?  ?  ? International Prostate Symptom Score  ? How often have you had the sensation of not emptying your bladder? Less than 1 in 5    ? How often have you had to urinate less than every two hours? Less than 1 in 5 times    ? How often have you found you stopped and started again several times when you urinated? Not at All    ? How often have you found it difficult to postpone urination? Less than 1 in 5 times    ? How often have you had a weak urinary stream? Not at All    ? How often have you had to strain to start urination? Not at All    ? How many times did you typically get up at night to urinate? 1 Time    ? Total IPSS Score 4    ?  ? Quality of Life due to urinary symptoms   ? If you were to spend the rest of your life with your urinary condition just the way it is now how would you feel about that? Delighted    ? ?  ?  ? ?  ? ? ?Score:  ?1-7 Mild ?8-19 Moderate ?20-35 Severe ? ? ? ?PMH: ?Past Medical History:  ?Diagnosis Date  ? Arthritis   ? COVID-19   ? Diastasis recti   ? GERD (gastroesophageal reflux disease)   ? High frequency hearing loss   ? Hyperlipoproteinemia   ? Hypertension   ? ? ?Surgical History: ?Past Surgical History:  ?Procedure Laterality Date  ? COLONOSCOPY  2021  ? gingival graft    ? HERNIA REPAIR    ? HOLEP-LASER ENUCLEATION OF THE PROSTATE WITH MORCELLATION N/A 11/26/2020  ? Procedure: HOLEP-LASER ENUCLEATION OF THE PROSTATE WITH MORCELLATION;  Surgeon: Hollice Espy, MD;  Location: [facility] ORS;  Service: Urology;  Laterality: N/A;  ? SEPTOPLASTY    ? VASECTOMY    ? WISDOM TOOTH EXTRACTION    ? ? ?Home Medications:  ?Allergies as of 07/02/2021   ? ?   Reactions  ? Ciprofloxacin Other (See Comments)  ? Myalgias (Muscle Pain), tendonitis  ? Meloxicam Other (See Comments)  ?  Increased urine frequency   ? Naproxen   ? headaches  ? Nsaids   ? Upset stomach  ? ?  ? ?  ?Medication List  ?  ? ?  ? Accurate as of Jul 02, 2021  2:11 PM. If you have any questions, ask your nurse or doctor.  ?  ?  ? ?  ? ?STOP taking these medications   ? ?lisinopril 10 MG tablet ?Commonly known as: ZESTRIL ?  ?Saw Palmetto 500 MG Caps ?  ? ?  ? ?TAKE these medications   ? ?amLODipine-benazepril 5-20 MG capsule ?Commonly known as: LOTREL ?Take 1 capsule by mouth daily. ?What changed: Another medication with the same name was removed. Continue taking this medication, and follow the directions you see here. ?  ?Cholecalciferol 25 MCG (1000 UT) capsule ?Take 1,000 Units by mouth daily. ?  ?Eliquis 5 MG Tabs tablet ?Generic drug: apixaban ?Take 5 mg by mouth 2 (two) times daily. ?  ?metoprolol succinate 25 MG 24 hr tablet ?Commonly known as: TOPROL-XL ?Take 1 tablet by mouth daily. ?  ?PSYLLIUM  PO ?Take 1 Dose by mouth 2 (two) times daily. 1 dose = 2 tablespoons ?  ? ?  ? ? ?Allergies:  ?Allergies  ?Allergen Reactions  ? Ciprofloxacin Other (See Comments)  ?  Myalgias (Muscle Pain), tendonitis ?  ? Meloxicam Other (See Comments)  ?  Increased urine frequency   ? Naproxen   ?  headaches  ? Nsaids   ?  Upset stomach  ? ? ?Family History: ?Family History  ?Problem Relation Age of Onset  ? Colon cancer Mother   ? Colon cancer Paternal Grandmother   ? Bladder Cancer Neg Hx   ? Prostate cancer Neg Hx   ? Kidney cancer Neg Hx   ? Testicular cancer Neg Hx   ? ? ?Social History:  reports that he has never smoked. He has never used smokeless tobacco. He reports that he does not currently use alcohol. He reports that he does not use drugs. ? ? ?Physical Exam: ?BP (!) 143/92   Pulse 69   Ht 5\' 8"  (1.727 m)   Wt 190 lb (86.2 kg)   BMI 28.89 kg/m?   ?Constitutional:  Alert and oriented, No acute distress.  Accompanied by his wife today. ?HEENT: [place] AT, moist mucus membranes.  Trachea midline, no masses. ?Cardiovascular: No clubbing, cyanosis, or edema. ?Respiratory: Normal respiratory effort, no increased work of breathing. ?Skin: No rashes, bruises or suspicious lesions. ?Neurologic: Grossly intact, no focal deficits, moving all 4 extremities. ?Psychiatric: Normal mood and affect. ? ? ?Assessment & Plan:   ?BPH with urinary obstruction ?- S/p HoLEP ?- He's had traumatic symptomatic improvement after procedure  ?- PSA has trended downward. Discussed that he can get his PSA drawn every year with his PCP, he is agreeable with this plan. ?- Discussed that if he symptoms return or increase he should follow-up sooner.  ? ?2. Stress incontinence  ?- Remarkable improvement doing pelvic floor exercises (minimal to no urinary leakage); continue pelvic floor exercises  ? ? ? Follow-up as needed  ? ?Conley Rolls as a scribe for Hollice Espy, MD.,have documented all relevant documentation on the behalf of Hollice Espy, MD,as directed by  Hollice Espy, MD while in the presence of Hollice Espy, MD. ? ?I have reviewed the above documentation for accuracy and completeness, and I agree with the above.  ? ?Hollice Espy, MD ? ?[facility] ?[address]  [place], [place] [address] ?[phone] ? ?

## 2021-09-15 ENCOUNTER — Encounter: Payer: Self-pay | Admitting: Urology

## 2021-10-15 ENCOUNTER — Encounter: Payer: Self-pay | Admitting: Physician Assistant

## 2021-10-15 ENCOUNTER — Ambulatory Visit (INDEPENDENT_AMBULATORY_CARE_PROVIDER_SITE_OTHER): Payer: Medicare Other | Admitting: Physician Assistant

## 2021-10-15 VITALS — BP 136/86 | HR 75 | Ht 68.0 in | Wt 190.0 lb

## 2021-10-15 DIAGNOSIS — R300 Dysuria: Secondary | ICD-10-CM

## 2021-10-15 DIAGNOSIS — R3129 Other microscopic hematuria: Secondary | ICD-10-CM

## 2021-10-15 LAB — BLADDER SCAN AMB NON-IMAGING: Scan Result: 0

## 2021-10-15 NOTE — Progress Notes (Unsigned)
10/15/2021 4:54 PM   Alejandro Hodges 04-Jun-1955 [id number]  CC: Chief Complaint  Patient presents with   Dysuria   Recurrent UTI   HPI: Alejandro Hodges is a 66 y.o. male with PMH A-fib on Eliquis and BPH with BOO s/p HOLEP who presents today for evaluation of possible UTI.   Today he reports 3-day history of urgency, frequency, and terminal dysuria/itching discomfort that persists after voiding.  He also notes approximately 2 weeks of urinary hesitancy.  No known history of stones.  He denies fever, chills, nausea, vomiting, flank pain, and gross hematuria.  He is a never smoker, previously working as an Geneticist, molecular for [employer].  He also reports months of postprandial fatigue.  No cross-sectional imaging available for review.  In-office UA today positive for trace glucose, trace intact blood, and trace leukocytes; urine microscopy with 3-10 RBCs/HPF. PVR 37m.  PMH: Past Medical History:  Diagnosis Date   Arthritis    COVID-19    Diastasis recti    GERD (gastroesophageal reflux disease)    High frequency hearing loss    Hyperlipoproteinemia    Hypertension     Surgical History: Past Surgical History:  Procedure Laterality Date   COLONOSCOPY  2021   gingival graft     HERNIA REPAIR     HOLEP-LASER ENUCLEATION OF THE PROSTATE WITH MORCELLATION N/A 11/26/2020   Procedure: HOLEP-LASER ENUCLEATION OF THE PROSTATE WITH MORCELLATION;  Surgeon: BHollice Espy MD;  Location: [facility] ORS;  Service: Urology;  Laterality: N/A;   SEPTOPLASTY     VASECTOMY     WISDOM TOOTH EXTRACTION      Home Medications:  Allergies as of 10/15/2021       Reactions   Ciprofloxacin Other (See Comments)   Myalgias (Muscle Pain), tendonitis   Meloxicam Other (See Comments)   Increased urine frequency    Naproxen    headaches   Nsaids    Upset stomach        Medication List        Accurate as of October 15, 2021  4:54 PM. If you have any questions, ask your nurse or doctor.           amLODipine-benazepril 5-20 MG capsule Commonly known as: LOTREL Take 1 capsule by mouth daily.   Cholecalciferol 25 MCG (1000 UT) capsule Take 1,000 Units by mouth daily.   Eliquis 5 MG Tabs tablet Generic drug: apixaban Take 5 mg by mouth 2 (two) times daily.   metoprolol succinate 25 MG 24 hr tablet Commonly known as: TOPROL-XL Take 1 tablet by mouth daily.   PSYLLIUM PO Take 1 Dose by mouth 2 (two) times daily. 1 dose = 2 tablespoons        Allergies:  Allergies  Allergen Reactions   Ciprofloxacin Other (See Comments)    Myalgias (Muscle Pain), tendonitis    Meloxicam Other (See Comments)    Increased urine frequency    Naproxen     headaches   Nsaids     Upset stomach    Family History: Family History  Problem Relation Age of Onset   Colon cancer Mother    Colon cancer Paternal Grandmother    Bladder Cancer Neg Hx    Prostate cancer Neg Hx    Kidney cancer Neg Hx    Testicular cancer Neg Hx     Social History:   reports that he has never smoked. He has never used smokeless tobacco. He reports that he does not currently use alcohol.  He reports that he does not use drugs.  Physical Exam: BP 136/86   Pulse 75   Ht '5\' 8"'  (1.727 m)   Wt 190 lb (86.2 kg)   BMI 28.89 kg/m   Constitutional:  Alert and oriented, no acute distress, nontoxic appearing HEENT: [place], AT Cardiovascular: No clubbing, cyanosis, or edema Respiratory: Normal respiratory effort, no increased work of breathing Skin: No rashes, bruises or suspicious lesions Neurologic: Grossly intact, no focal deficits, moving all 4 extremities Psychiatric: Normal mood and affect  Laboratory Data: Results for orders placed or performed in visit on 10/15/21  Microscopic Examination   Urine  Result Value Ref Range   WBC, UA 0-5 0 - 5 /hpf   RBC, Urine 3-10 (A) 0 - 2 /hpf   Epithelial Cells (non renal) 0-10 0 - 10 /hpf   Bacteria, UA None seen None seen/Few  Urinalysis, Complete  Result  Value Ref Range   Specific Gravity, UA 1.015 1.005 - 1.030   pH, UA 6.0 5.0 - 7.5   Color, UA Yellow Yellow   Appearance Ur Clear Clear   Leukocytes,UA Trace (A) Negative   Protein,UA Negative Negative/Trace   Glucose, UA Trace (A) Negative   Ketones, UA Negative Negative   RBC, UA Trace (A) Negative   Bilirubin, UA Negative Negative   Urobilinogen, Ur 0.2 0.2 - 1.0 mg/dL   Nitrite, UA Negative Negative   Microscopic Examination See below:   Bladder Scan (Post Void Residual) in office  Result Value Ref Range   Scan Result 0    Assessment & Plan:   1. Dysuria UA today is overall bland despite #2 below.  Low suspicion for infection today, though will send for culture to confirm.  He is emptying appropriately. - Urinalysis, Complete - CULTURE, URINE COMPREHENSIVE - Bladder Scan (Post Void Residual) in office  2. Microscopic hematuria Noted on UA today. I explained that blood in the urine can be caused by a myriad of factors, including but not limited to infection, stones, cysts, BPH, anticoagulation, and urinary tract malignancies.  I explained that the recommended work-up for blood in the urine is twofold and includes a CT urogram for evaluation of the upper urinary tract including kidneys and ureters as well as a cystoscopy for evaluation of the urethra and bladder.  I recommended that we proceed with this at this time.  Patient agreed; CTU ordered and follow-up cysto with CTU results scheduled. - CT HEMATURIA WORKUP; Future  Return in about 3 weeks (around 11/05/2021) for Cysto and CTU results.  Debroah Loop, PA-C  [facility] [address], [place], [place] [address] [phone]

## 2021-10-16 LAB — URINALYSIS, COMPLETE
Bilirubin, UA: NEGATIVE
Ketones, UA: NEGATIVE
Nitrite, UA: NEGATIVE
Protein,UA: NEGATIVE
Specific Gravity, UA: 1.015 (ref 1.005–1.030)
Urobilinogen, Ur: 0.2 mg/dL (ref 0.2–1.0)
pH, UA: 6 (ref 5.0–7.5)

## 2021-10-16 LAB — MICROSCOPIC EXAMINATION: Bacteria, UA: NONE SEEN

## 2021-10-18 LAB — CULTURE, URINE COMPREHENSIVE

## 2021-10-24 ENCOUNTER — Ambulatory Visit
Admission: RE | Admit: 2021-10-24 | Discharge: 2021-10-24 | Disposition: A | Discharge status: Home / Self Care | Payer: Medicare Other | Source: Ambulatory Visit | Attending: Physician Assistant | Admitting: Physician Assistant

## 2021-10-24 DIAGNOSIS — R3129 Other microscopic hematuria: Secondary | ICD-10-CM

## 2021-10-24 LAB — POCT I-STAT CREATININE: Creatinine, Ser: 1 mg/dL (ref 0.61–1.24)

## 2021-10-24 MED ORDER — IOHEXOL 300 MG/ML  SOLN
100.0000 mL | Freq: Once | INTRAMUSCULAR | Status: AC | PRN
  Administered 2021-10-24: 100 mL via INTRAVENOUS

## 2021-11-06 ENCOUNTER — Ambulatory Visit (INDEPENDENT_AMBULATORY_CARE_PROVIDER_SITE_OTHER): Payer: Medicare Other | Admitting: Urology

## 2021-11-06 ENCOUNTER — Other Ambulatory Visit: Payer: Self-pay

## 2021-11-06 ENCOUNTER — Encounter: Payer: Self-pay | Admitting: Urology

## 2021-11-06 VITALS — BP 161/96 | HR 60 | Ht 68.0 in | Wt 193.0 lb

## 2021-11-06 DIAGNOSIS — R300 Dysuria: Secondary | ICD-10-CM

## 2021-11-06 DIAGNOSIS — R3129 Other microscopic hematuria: Secondary | ICD-10-CM

## 2021-11-06 DIAGNOSIS — N138 Other obstructive and reflux uropathy: Secondary | ICD-10-CM

## 2021-11-06 DIAGNOSIS — N401 Enlarged prostate with lower urinary tract symptoms: Secondary | ICD-10-CM

## 2021-11-06 LAB — URINALYSIS, COMPLETE
Bilirubin, UA: NEGATIVE
Glucose, UA: NEGATIVE
Ketones, UA: NEGATIVE
Leukocytes,UA: NEGATIVE
Nitrite, UA: NEGATIVE
Protein,UA: NEGATIVE
RBC, UA: NEGATIVE
Specific Gravity, UA: 1.015 (ref 1.005–1.030)
Urobilinogen, Ur: 0.2 mg/dL (ref 0.2–1.0)
pH, UA: 7 (ref 5.0–7.5)

## 2021-11-06 LAB — MICROSCOPIC EXAMINATION: Bacteria, UA: NONE SEEN

## 2021-11-06 NOTE — Progress Notes (Signed)
   11/06/21  CC:  Chief Complaint  Patient presents with   Cysto    HPI: 66 year old male with a personal history of dysuria and microscopic hematuria who presents today for cystoscopy.  Notably, he underwent HoLEP in 11/2020.  He was seen by PA with UTI type symptoms however his urinalysis was fairly unremarkable other than for some microscopic blood.  Over the weekend, he had major urinary issue.  He began have weak stream difficulty urinating with burning and blood.  He felt like he was having difficulty emptying his bladder.  He was also having pain associated with this.  He ultimately passed a very large dystrophic calcification which he brings with him today.  Measures approximately 2 and half centimeters by 1 and half centimeters.  Since passing this, he has had no further issues.  He had a CT urogram which showed findings consistent with previous HoLEP including dystrophic calcifications.  There is otherwise no GU pathology.  There is a typo which reads hydronephrosis in the impression but there in fact is no hydronephrosis.  Height 5\' 8"  (1.727 m). NED. A&Ox3.   No respiratory distress   Abd soft, NT, ND Normal phallus with bilateral descended testicles  Cystoscopy Procedure Note  Patient identification was confirmed, informed consent was obtained, and patient was prepped using Betadine solution.  Lidocaine jelly was administered per urethral meatus.     Pre-Procedure: - Inspection reveals a normal caliber ureteral meatus.  Procedure: The flexible cystoscope was introduced without difficulty - No urethral strictures/lesions are present. -Large widely patent mucosalized prostatic fossa consistent with HoLEP defect, slightly irregular -  Open  bladder neck - Bilateral ureteral orifices identified - Bladder mucosa  reveals no ulcers, tumors, or lesions - No bladder stones - No trabeculation  Retroflexion unremarkable without intravesical  prostate   Post-Procedure: - Patient tolerated the procedure well  Assessment/ Plan:  1. Microscopic hematuria Likely secondary to interval passage of a large dystrophic  prostatic calcification which he brings with him today  Cystoscopy today is very reassuring, no further calcifications appreciated or any other pathology  Upper tract imaging also fairly unremarkable  We did discuss incidental finding of esophageal thickening, he has a gastroenterologist and will follow-up with him for consideration of endoscopy - Urinalysis, Complete  2. Benign prostatic hyperplasia with urinary obstruction Doing well - Urinalysis, Complete  F/u IPSS/ PVR 06/2022  07/2022, MD

## 2021-11-12 ENCOUNTER — Ambulatory Visit: Payer: Medicare Other | Admitting: Urology

## 2022-06-30 ENCOUNTER — Other Ambulatory Visit: Payer: Self-pay

## 2022-06-30 DIAGNOSIS — R972 Elevated prostate specific antigen [PSA]: Secondary | ICD-10-CM

## 2022-06-30 DIAGNOSIS — N401 Enlarged prostate with lower urinary tract symptoms: Secondary | ICD-10-CM

## 2022-07-01 ENCOUNTER — Other Ambulatory Visit: Payer: Medicare Other

## 2022-07-01 DIAGNOSIS — N401 Enlarged prostate with lower urinary tract symptoms: Secondary | ICD-10-CM

## 2022-07-01 DIAGNOSIS — R972 Elevated prostate specific antigen [PSA]: Secondary | ICD-10-CM

## 2022-07-02 LAB — PSA: Prostate Specific Ag, Serum: 0.7 ng/mL (ref 0.0–4.0)

## 2022-07-08 ENCOUNTER — Ambulatory Visit (INDEPENDENT_AMBULATORY_CARE_PROVIDER_SITE_OTHER): Payer: Medicare Other | Admitting: Urology

## 2022-07-08 ENCOUNTER — Encounter: Payer: Self-pay | Admitting: Urology

## 2022-07-08 VITALS — BP 156/88 | HR 65 | Ht 68.0 in | Wt 190.0 lb

## 2022-07-08 DIAGNOSIS — N401 Enlarged prostate with lower urinary tract symptoms: Secondary | ICD-10-CM

## 2022-07-08 DIAGNOSIS — R338 Other retention of urine: Secondary | ICD-10-CM | POA: Diagnosis not present

## 2022-07-08 LAB — BLADDER SCAN AMB NON-IMAGING: Scan Result: 13

## 2022-07-08 NOTE — Progress Notes (Signed)
I,Amy L Pierron,acting as a scribe for [name], MD.,have documented all relevant documentation on the behalf of [name], MD,as directed by  [name], MD while in the presence of [name], MD.  07/08/2022 1:59 PM   [name] 1955-12-17 [id number]  Referring provider: Bernette Redbird, MD [address] [place],  [place] [address]  Chief Complaint  Patient presents with   Benign Prostatic Hypertrophy    HPI: 67 year-old male with a personal history of massive BPH with bladder outlet obstructions returns today for routine follow-up.   He underwent a HoLep in October 2022. Surgical pathology was benign. He had 153 gram resection. Postoperative course is complicated by need for overnight CBI. He had issues passing necrotic pieces of tissue following the procedure, as well as another large dystrophic calcification. He had a cystoscopy in September 2023 showing a large HoLep defect. He is no longer on any BPH medications.   His most recent PSA from 07/01/2022 was 0.7.  He reports having occasional leakage doing floor exercises and after showering on occasion but overall dry. He is doing well overall.   Results for orders placed or performed in visit on 07/08/22  Bladder Scan (Post Void Residual) in office  Result Value Ref Range   Scan Result 13     IPSS     Row Name 07/08/22 1300         International Prostate Symptom Score   How often have you had the sensation of not emptying your bladder? Less than 1 in 5     How often have you had to urinate less than every two hours? Less than 1 in 5 times     How often have you found you stopped and started again several times when you urinated? Not at All     How often have you found it difficult to postpone urination? Not at All     How often have you had a weak urinary stream? Not at All     How often have you had to strain to start urination? Not at All     How many times did you typically get up at  night to urinate? 1 Time     Total IPSS Score 3       Quality of Life due to urinary symptoms   If you were to spend the rest of your life with your urinary condition just the way it is now how would you feel about that? Delighted            Score:  1-7 Mild 8-19 Moderate 20-35 Severe    PMH: Past Medical History:  Diagnosis Date   Arthritis    COVID-19    Diastasis recti    GERD (gastroesophageal reflux disease)    High frequency hearing loss    Hyperlipoproteinemia    Hypertension     Surgical History: Past Surgical History:  Procedure Laterality Date   COLONOSCOPY  2021   gingival graft     HERNIA REPAIR     HOLEP-LASER ENUCLEATION OF THE PROSTATE WITH MORCELLATION N/A 11/26/2020   Procedure: HOLEP-LASER ENUCLEATION OF THE PROSTATE WITH MORCELLATION;  Surgeon: [name], MD;  Location: [facility] OR[facility];  Service: Urology;  Laterality: N/A;   SEPTOPLASTY     VASECTOMY     WISDOM TOOTH EXTRACTION      Home Medications:  Allergies as of 07/08/2022       Reactions   Ciprofloxacin Other (See Comments)  Myalgias (Muscle Pain), tendonitis   Meloxicam Other (See Comments)   Increased urine frequency    Tree Extract Shortness Of Breath   Naproxen    headaches   Nsaids    Upset stomach        Medication List        Accurate as of Jul 08, 2022  1:59 PM. If you have any questions, ask your nurse or doctor.          amLODipine-benazepril 5-20 MG capsule Commonly known as: LOTREL Take 1 capsule by mouth daily.   azelastine 0.1 % nasal spray Commonly known as: ASTELIN Place into both nostrils 2 (two) times daily. Use in each nostril as directed   Cholecalciferol 25 MCG (1000 UT) capsule Take 1,000 Units by mouth daily.   clindamycin-benzoyl peroxide gel Commonly known as: BENZACLIN Apply topically daily at 6 (six) AM. Apply daily to scalp   Eliquis 5 MG Tabs tablet Generic drug: apixaban Take 5 mg by mouth 2 (two) times daily.   fluticasone 50  MCG/ACT nasal spray Commonly known as: FLONASE Place into both nostrils daily.   metoprolol succinate 25 MG 24 hr tablet Commonly known as: TOPROL-XL Take 1 tablet by mouth daily.   omeprazole 20 MG capsule Commonly known as: PRILOSEC Take 20 mg by mouth daily.   PSYLLIUM PO Take 1 Dose by mouth 2 (two) times daily. 1 dose = 2 tablespoons        Allergies:  Allergies  Allergen Reactions   Ciprofloxacin Other (See Comments)    Myalgias (Muscle Pain), tendonitis    Meloxicam Other (See Comments)    Increased urine frequency    Tree Extract Shortness Of Breath   Naproxen     headaches   Nsaids     Upset stomach    Family History: Family History  Problem Relation Age of Onset   Colon cancer Mother    Colon cancer Paternal Grandmother    Bladder Cancer Neg Hx    Prostate cancer Neg Hx    Kidney cancer Neg Hx    Testicular cancer Neg Hx     Social History:  reports that he has never smoked. He has never used smokeless tobacco. He reports that he does not currently use alcohol. He reports that he does not use drugs.   Physical Exam: BP (!) 156/88   Pulse 65   Ht 5\' 8"  (1.727 m)   Wt 190 lb (86.2 kg)   BMI 28.89 kg/m   Constitutional:  Alert and oriented, No acute distress. HEENT: [place] AT, moist mucus membranes.  Trachea midline, no masses. Neurologic: Grossly intact, no focal deficits, moving all 4 extremities. Psychiatric: Normal mood and affect.  Assessment & Plan:    BPH with outlet obstruction  - Doing well.  -No new symptoms.  -OK to f/u prn, PCP to check his PSA each year along with normal labs.   Return if symptoms worsen or fail to improve.  I have reviewed the above documentation for accuracy and completeness, and I agree with the above.   [name], MD    [facility] [address] [place], [place] [address] [phone]

## 2022-11-24 IMAGING — US US SCROTUM W/ DOPPLER COMPLETE
1 series · 13 of 25 positions shown · non-contrast
Comparison: None.

CLINICAL DATA: Left upper scrotal pain 4 days, recent HoLEP
procedure

EXAM:
SCROTAL ULTRASOUND
DOPPLER ULTRASOUND OF THE TESTICLES
TECHNIQUE: Complete ultrasound examination of the testicles, epididymis, and
other scrotal structures was performed. Color and spectral Doppler
ultrasound were also utilized to evaluate blood flow to the
testicles.

[Series 1: us scrotum w/ doppler complete · 0.07mm/px · 13 of 37 slices shown]
[im 1/37]
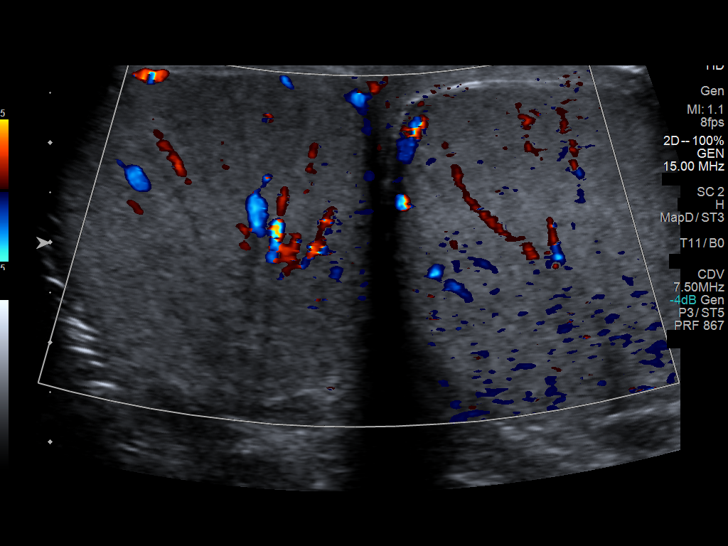
[im 4/37]
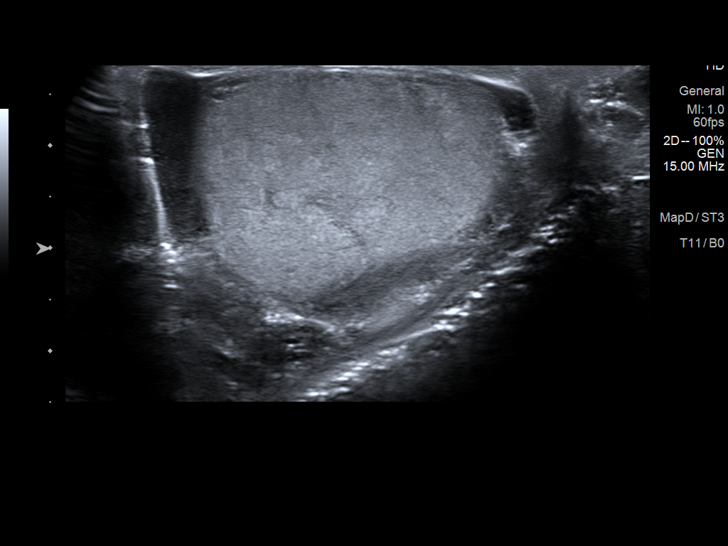
[im 7/37]
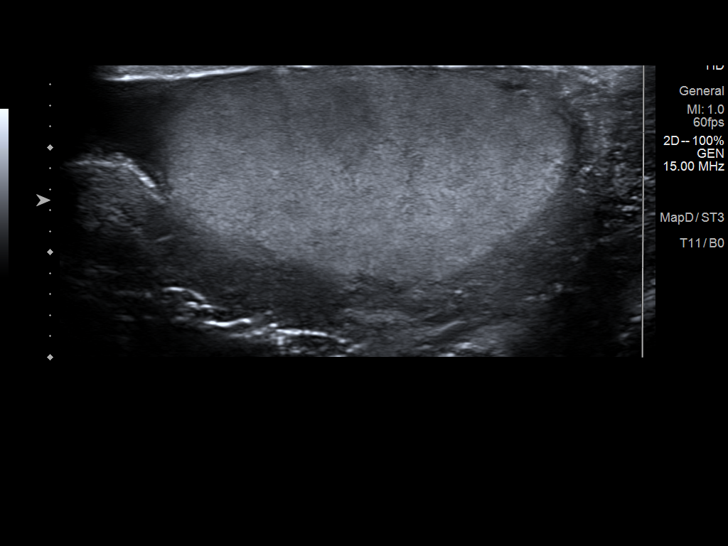
[im 10/37]
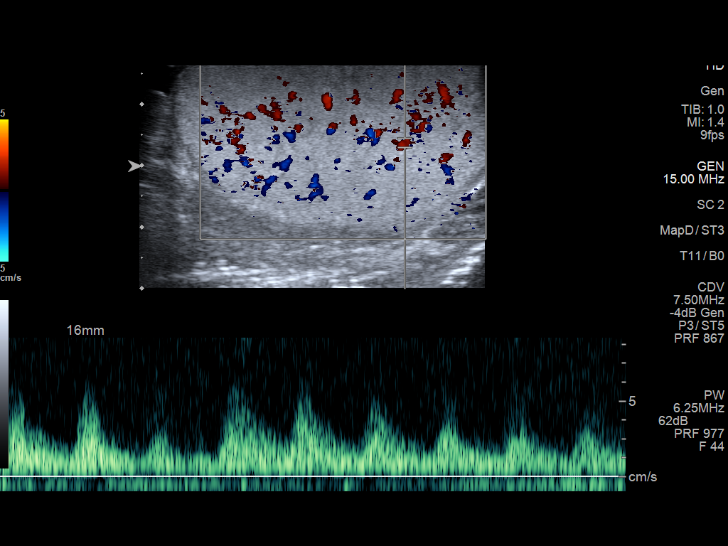
[im 13/37]
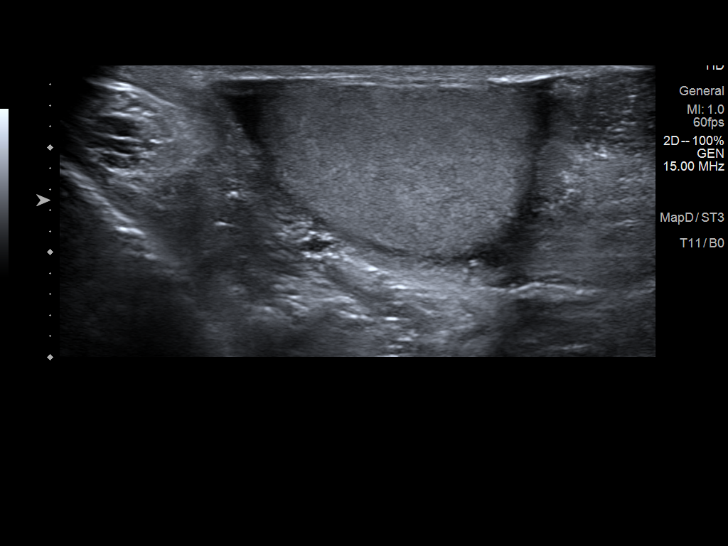
[im 16/37]
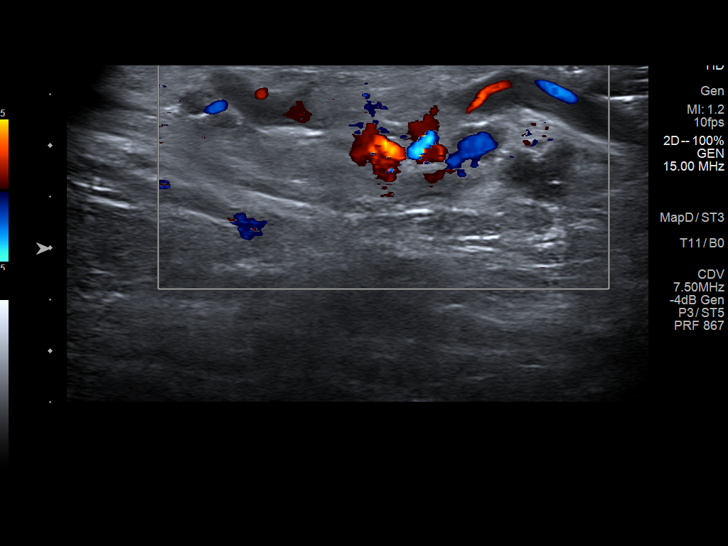
[im 19/37]
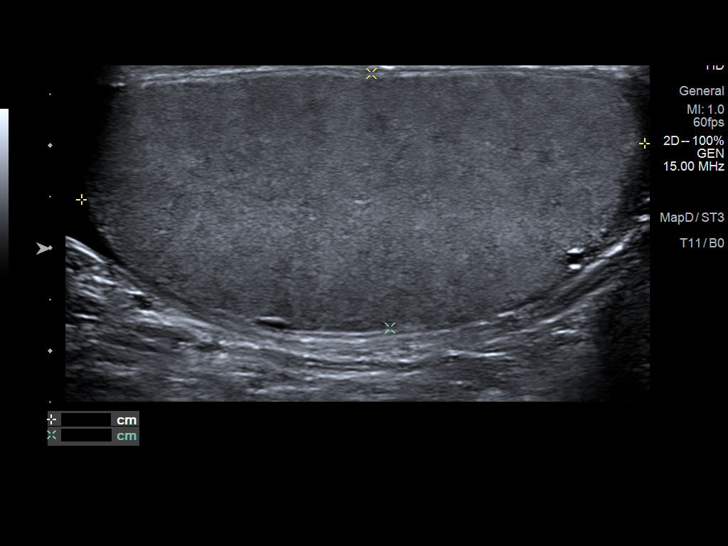
[im 22/37]
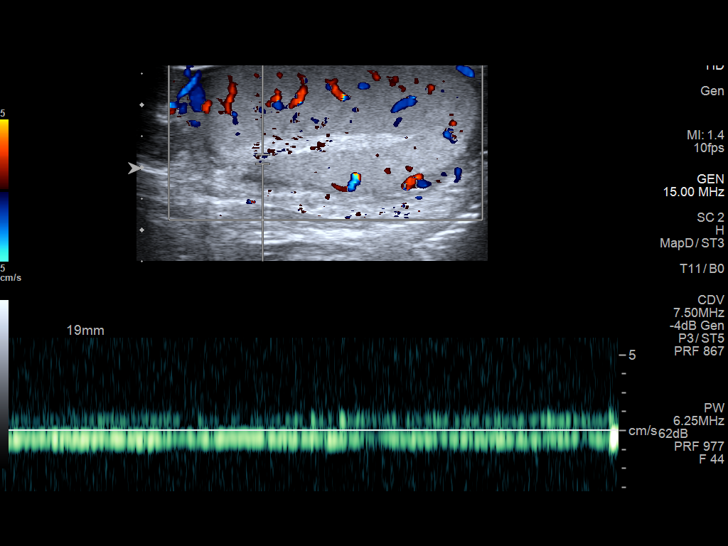
[im 25/37]
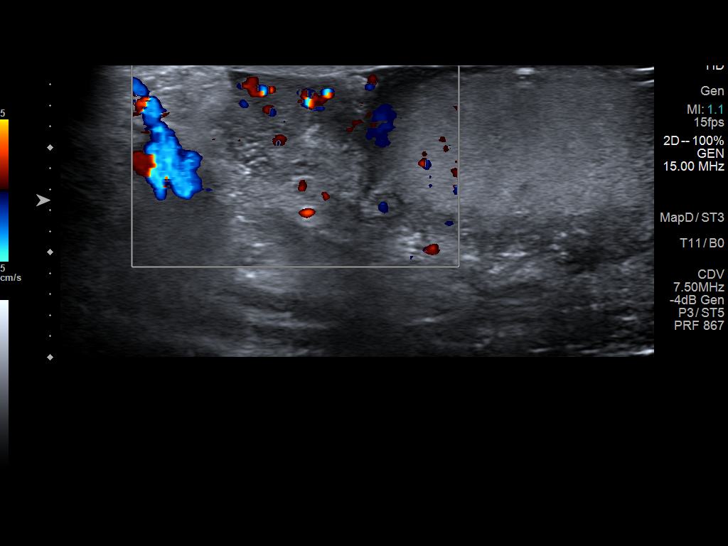
[im 28/37]
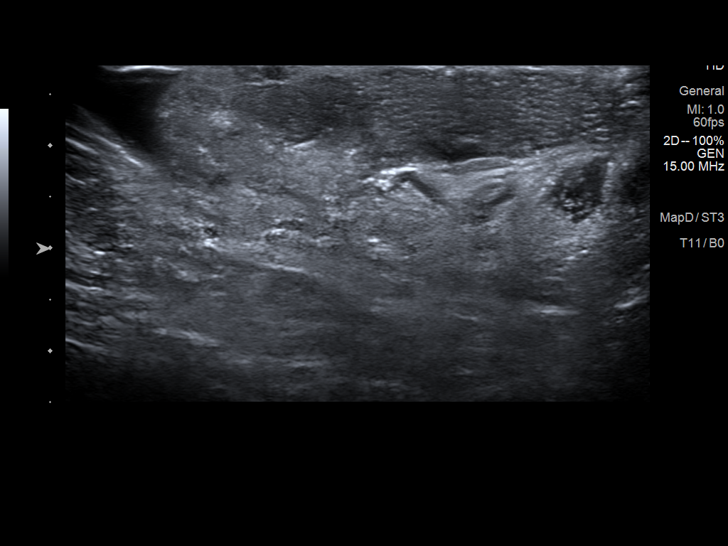
[im 31/37]
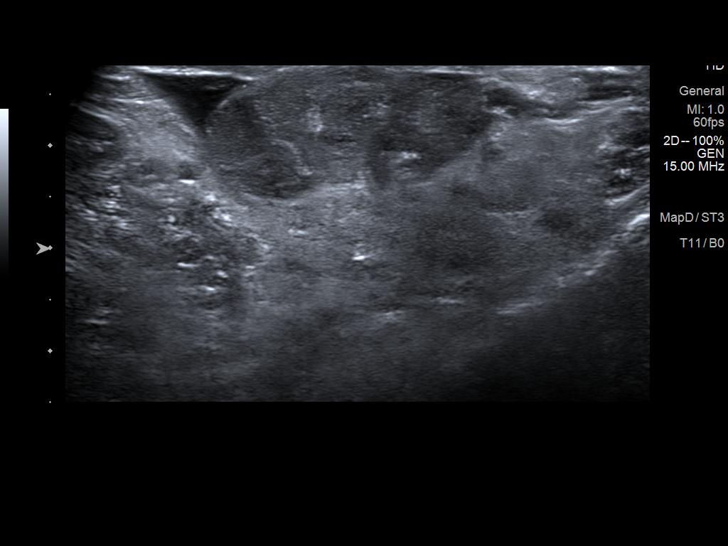
[im 34/37]
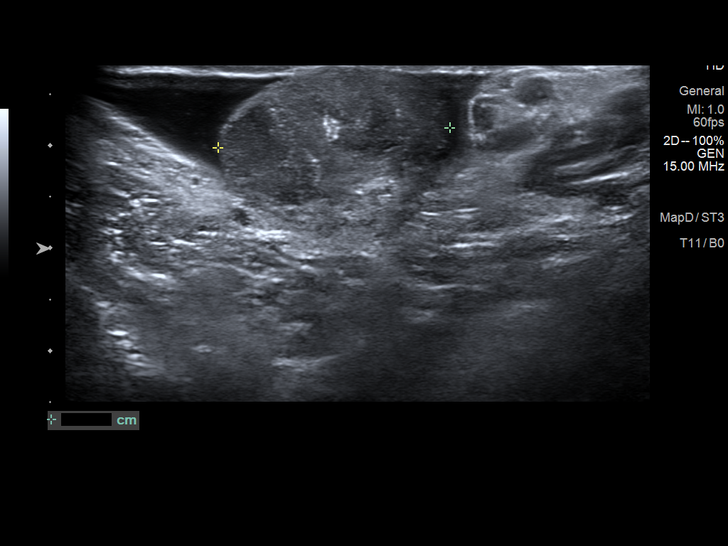
[im 37/37]
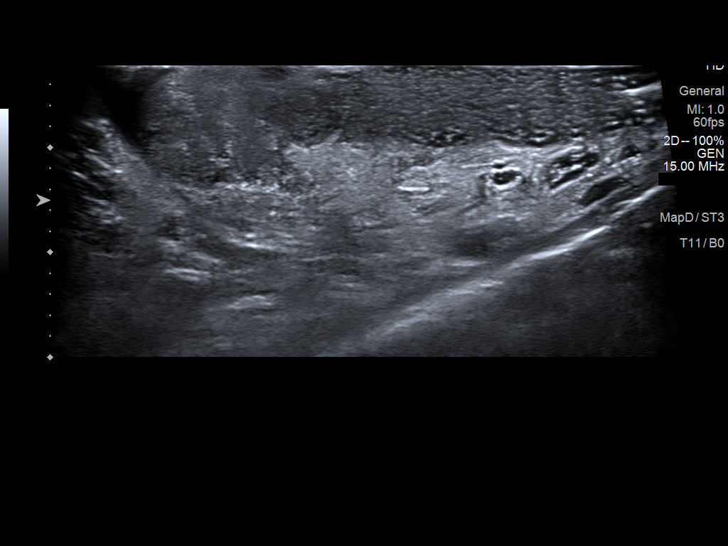

[13 of 25 positions shown; findings below may reference images not displayed]

FINDINGS: Right testicle

Measurements: 5.2 x 2.9 x 3.4 cm. No mass or microlithiasis
visualized.

Left testicle

Measurements: 5.5 x 2.5 x 4.0 cm. No mass or microlithiasis
visualized.

Right epididymis:  Normal in size and appearance.

Left epididymis:  Mildly enlarged, hyperemic left epididymal head.

Hydrocele:  Small bilateral hydroceles, left greater than right.

Varicocele:  None visualized.

Pulsed Doppler interrogation of both testes demonstrates normal low
resistance arterial and venous waveforms bilaterally.
IMPRESSION: 1. Mildly enlarged, hyperemic left epididymal head, which may
reflect epididymitis.
2. Small bilateral hydroceles, left greater than right, likely
reactive.
3. Normal sonographic appearance of the bilateral testicles with
normal arterial and venous Doppler flow.

## 2023-05-18 ENCOUNTER — Ambulatory Visit (INDEPENDENT_AMBULATORY_CARE_PROVIDER_SITE_OTHER): Admitting: Physician Assistant

## 2023-05-18 VITALS — BP 164/93 | HR 60 | Ht 68.0 in | Wt 195.0 lb

## 2023-05-18 DIAGNOSIS — N503 Cyst of epididymis: Secondary | ICD-10-CM | POA: Diagnosis not present

## 2023-05-18 LAB — URINALYSIS, COMPLETE
Bilirubin, UA: NEGATIVE
Glucose, UA: NEGATIVE
Ketones, UA: NEGATIVE
Leukocytes,UA: NEGATIVE
Nitrite, UA: NEGATIVE
Protein,UA: NEGATIVE
RBC, UA: NEGATIVE
Specific Gravity, UA: 1.01 (ref 1.005–1.030)
Urobilinogen, Ur: 0.2 mg/dL (ref 0.2–1.0)
pH, UA: 7 (ref 5.0–7.5)

## 2023-05-18 LAB — MICROSCOPIC EXAMINATION
Bacteria, UA: NONE SEEN
RBC, Urine: NONE SEEN /HPF (ref 0–2)

## 2023-05-18 MED ORDER — CELECOXIB 100 MG PO CAPS: 100.0000 mg | ORAL_CAPSULE | Freq: Two times a day (BID) | ORAL | 0 refills | Status: AC

## 2023-05-18 NOTE — Progress Notes (Unsigned)
 05/18/2023 11:18 AM   Alejandro Hodges 03/23/1955 [id number]  CC: Chief Complaint  Patient presents with   Dysuria   HPI: Alejandro Hodges is a 68 y.o. male with PMH A-fib on Eliquis and BPH with p.o. s/p HOLEP in October 2022 who presents today for evaluation of a painful lump on his left testicle.   Today he reports he noted a tender lump above his left testicle about 2 weeks ago.  It improved spontaneously, but is a little bit sore again this morning.  This reminds him of his last episode of epididymitis, however he is not having any dysuria or scrotal swelling this time.  In-office UA and microscopy pan negative.  PMH: Past Medical History:  Diagnosis Date   Arthritis    COVID-19    Diastasis recti    GERD (gastroesophageal reflux disease)    High frequency hearing loss    Hyperlipoproteinemia    Hypertension     Surgical History: Past Surgical History:  Procedure Laterality Date   COLONOSCOPY  2021   gingival graft     HERNIA REPAIR     HOLEP-LASER ENUCLEATION OF THE PROSTATE WITH MORCELLATION N/A 11/26/2020   Procedure: HOLEP-LASER ENUCLEATION OF THE PROSTATE WITH MORCELLATION;  Surgeon: [name], MD;  Location: [facility];  Service: Urology;  Laterality: N/A;   SEPTOPLASTY     VASECTOMY     WISDOM TOOTH EXTRACTION      Home Medications:  Allergies as of 05/18/2023       Reactions   Ciprofloxacin Other (See Comments)   Myalgias (Muscle Pain), tendonitis   Meloxicam Other (See Comments)   Increased urine frequency    Tree Extract Shortness Of Breath   Naproxen    headaches   Nsaids    Upset stomach        Medication List        Accurate as of May 18, 2023 11:18 AM. If you have any questions, ask your nurse or doctor.          STOP taking these medications    metoprolol succinate 25 MG 24 hr tablet Commonly known as: TOPROL-XL Stopped by: Carman Ching       TAKE these medications    amLODipine-benazepril 5-20 MG  capsule Commonly known as: LOTREL Take 1 capsule by mouth daily.   azelastine 0.1 % nasal spray Commonly known as: ASTELIN Place into both nostrils 2 (two) times daily. Use in each nostril as directed   Cholecalciferol 25 MCG (1000 UT) capsule Take 1,000 Units by mouth daily.   clindamycin-benzoyl peroxide gel Commonly known as: BENZACLIN Apply topically daily at 6 (six) AM. Apply daily to scalp   Eliquis 5 MG Tabs tablet Generic drug: apixaban Take 5 mg by mouth 2 (two) times daily.   fluticasone 50 MCG/ACT nasal spray Commonly known as: FLONASE Place into both nostrils daily.   omeprazole 20 MG capsule Commonly known as: PRILOSEC Take 20 mg by mouth daily.   PSYLLIUM PO Take 1 Dose by mouth 2 (two) times daily. 1 dose = 2 tablespoons        Allergies:  Allergies  Allergen Reactions   Ciprofloxacin Other (See Comments)    Myalgias (Muscle Pain), tendonitis    Meloxicam Other (See Comments)    Increased urine frequency    Tree Extract Shortness Of Breath   Naproxen     headaches   Nsaids     Upset stomach    Family History: Family History  Problem Relation  Age of Onset   Colon cancer Mother    Colon cancer Paternal Grandmother    Bladder Cancer Neg Hx    Prostate cancer Neg Hx    Kidney cancer Neg Hx    Testicular cancer Neg Hx     Social History:   reports that he has never smoked. He has never used smokeless tobacco. He reports that he does not currently use alcohol. He reports that he does not use drugs.  Physical Exam: BP (!) 164/93   Pulse 60   Ht 5\' 8"  (1.727 m)   Wt 195 lb (88.5 kg)   BMI 29.65 kg/m   Constitutional:  Alert and oriented, no acute distress, nontoxic appearing HEENT: [place], AT Cardiovascular: No clubbing, cyanosis, or edema Respiratory: Normal respiratory effort, no increased work of breathing GU: Bilateral descended testicles.  Nontender and nonenlarged bilateral testes and epididymides.  There is a small, supratesticular  left intrascrotal cyst that is minimally tender to palpation. Skin: No rashes, bruises or suspicious lesions Neurologic: Grossly intact, no focal deficits, moving all 4 extremities Psychiatric: Normal mood and affect  Laboratory Data: Results for orders placed or performed in visit on 07/08/22  Bladder Scan (Post Void Residual) in office   Collection Time: 07/08/22  1:32 PM  Result Value Ref Range   Scan Result 13    Assessment & Plan:   1. Epididymal cyst (Primary) Small cyst above the left testicle within the spermatic cord, minimally tender on exam today.  With subacute presentation, no indication for urgent imaging today.  UA is bland, overall low suspicion for UTI.  Will treat with 1 week of Celebrex and if his symptoms do not improve or worsen, will pursue scrotal ultrasound for further evaluation.  He is in agreement with this plan. - Urinalysis, Complete - CULTURE, URINE COMPREHENSIVE - celecoxib (CELEBREX) 100 MG capsule; Take 1 capsule (100 mg total) by mouth 2 (two) times daily with a meal for 7 days.  Dispense: 14 capsule; Refill: 0   Return if symptoms worsen or fail to improve.  Carman Ching, PA-C  [facility] [place] [address] [place], [place] [address] [phone]

## 2023-05-21 LAB — CULTURE, URINE COMPREHENSIVE

## 2023-05-22 ENCOUNTER — Ambulatory Visit: Admitting: Physician Assistant
